# Patient Record
Sex: Male | Born: 1935 | Race: Black or African American | Hispanic: No | Marital: Married | Smoking: Former smoker
Health system: Southern US, Community
[De-identification: ages and names within clinical notes are randomized; demographics above are authoritative.]

## PROBLEM LIST (undated history)

## (undated) DIAGNOSIS — E119 Type 2 diabetes mellitus without complications: Secondary | ICD-10-CM

## (undated) DIAGNOSIS — H9190 Unspecified hearing loss, unspecified ear: Secondary | ICD-10-CM

## (undated) DIAGNOSIS — F0390 Unspecified dementia without behavioral disturbance: Secondary | ICD-10-CM

## (undated) DIAGNOSIS — I10 Essential (primary) hypertension: Secondary | ICD-10-CM

## (undated) DIAGNOSIS — K219 Gastro-esophageal reflux disease without esophagitis: Secondary | ICD-10-CM

## (undated) HISTORY — PX: EYE SURGERY: SHX253

---

## 2003-06-08 ENCOUNTER — Other Ambulatory Visit: Payer: Self-pay

## 2005-06-27 ENCOUNTER — Inpatient Hospital Stay: Payer: Self-pay | Admitting: Internal Medicine

## 2005-06-27 ENCOUNTER — Other Ambulatory Visit: Payer: Self-pay

## 2007-07-29 ENCOUNTER — Ambulatory Visit: Payer: Self-pay | Admitting: Family Medicine

## 2010-06-02 ENCOUNTER — Inpatient Hospital Stay: Payer: Self-pay | Admitting: Internal Medicine

## 2013-02-18 ENCOUNTER — Inpatient Hospital Stay: Payer: Self-pay | Admitting: Internal Medicine

## 2013-02-18 LAB — HEMOGLOBIN A1C: Hemoglobin A1C: 7.8 % — ABNORMAL HIGH (ref 4.2–6.3)

## 2013-02-18 LAB — LIPID PANEL
HDL Cholesterol: 77 mg/dL — ABNORMAL HIGH (ref 40–60)
Ldl Cholesterol, Calc: 89 mg/dL (ref 0–100)
Triglycerides: 66 mg/dL (ref 0–200)

## 2013-02-18 LAB — COMPREHENSIVE METABOLIC PANEL
Alkaline Phosphatase: 90 U/L (ref 50–136)
Anion Gap: 6 — ABNORMAL LOW (ref 7–16)
BUN: 15 mg/dL (ref 7–18)
Bilirubin,Total: 1 mg/dL (ref 0.2–1.0)
Calcium, Total: 9.5 mg/dL (ref 8.5–10.1)
Chloride: 103 mmol/L (ref 98–107)
EGFR (African American): >60
EGFR (Non-African Amer.): 52 — ABNORMAL LOW
Glucose: 257 mg/dL — ABNORMAL HIGH (ref 65–99)
Osmolality: 282 (ref 275–301)
Potassium: 3.6 mmol/L (ref 3.5–5.1)
SGOT(AST): 27 U/L (ref 15–37)
Sodium: 136 mmol/L (ref 136–145)
Total Protein: 7.4 g/dL (ref 6.4–8.2)

## 2013-02-18 LAB — APTT: Activated PTT: 34.3 secs (ref 23.6–35.9)

## 2013-02-18 LAB — CBC WITH DIFFERENTIAL/PLATELET
Basophil %: 0.7 %
Eosinophil %: 2.4 %
HGB: 12.7 g/dL — ABNORMAL LOW (ref 13.0–18.0)
Lymphocyte #: 1 10*3/uL (ref 1.0–3.6)
Lymphocyte %: 25.3 %
MCHC: 33.8 g/dL (ref 32.0–36.0)
Monocyte #: 0.4 x10 3/mm (ref 0.2–1.0)
Neutrophil #: 2.3 10*3/uL (ref 1.4–6.5)
Neutrophil %: 62 %
RBC: 4.28 10*6/uL — ABNORMAL LOW (ref 4.40–5.90)
RDW: 13.5 % (ref 11.5–14.5)

## 2013-02-18 LAB — URINALYSIS, COMPLETE
Blood: NEGATIVE
Glucose,UR: >=500 mg/dL (ref 0–75)
Ketone: NEGATIVE
Leukocyte Esterase: NEGATIVE
Nitrite: NEGATIVE
RBC,UR: 1 /HPF (ref 0–5)

## 2013-02-18 LAB — PROTIME-INR: INR: 1.1

## 2013-02-19 LAB — CBC WITH DIFFERENTIAL/PLATELET
Basophil %: 0.9 %
HCT: 35.1 % — ABNORMAL LOW (ref 40.0–52.0)
HGB: 11.7 g/dL — ABNORMAL LOW (ref 13.0–18.0)
MCH: 29.5 pg (ref 26.0–34.0)
MCHC: 33.4 g/dL (ref 32.0–36.0)
MCV: 88 fL (ref 80–100)
Neutrophil #: 1.2 10*3/uL — ABNORMAL LOW (ref 1.4–6.5)
Neutrophil %: 37.9 %
Platelet: 293 10*3/uL (ref 150–440)
RBC: 3.99 10*6/uL — ABNORMAL LOW (ref 4.40–5.90)
RDW: 13.2 % (ref 11.5–14.5)
WBC: 3.1 10*3/uL — ABNORMAL LOW (ref 3.8–10.6)

## 2013-02-19 LAB — BASIC METABOLIC PANEL
Co2: 28 mmol/L (ref 21–32)
Creatinine: 1.07 mg/dL (ref 0.60–1.30)
EGFR (African American): >60
EGFR (Non-African Amer.): 60 — ABNORMAL LOW
Osmolality: 279 (ref 275–301)
Potassium: 3.6 mmol/L (ref 3.5–5.1)

## 2013-07-11 ENCOUNTER — Emergency Department: Payer: Self-pay | Admitting: Emergency Medicine

## 2013-07-11 LAB — CBC
HCT: 35.1 % — ABNORMAL LOW (ref 40.0–52.0)
HGB: 11.7 g/dL — AB (ref 13.0–18.0)
MCH: 30 pg (ref 26.0–34.0)
MCHC: 33.3 g/dL (ref 32.0–36.0)
MCV: 90 fL (ref 80–100)
PLATELETS: 321 10*3/uL (ref 150–440)
RBC: 3.89 10*6/uL — AB (ref 4.40–5.90)
RDW: 13 % (ref 11.5–14.5)
WBC: 3.7 10*3/uL — AB (ref 3.8–10.6)

## 2013-07-11 LAB — TROPONIN I

## 2013-07-11 LAB — COMPREHENSIVE METABOLIC PANEL
Albumin: 3.3 g/dL — ABNORMAL LOW (ref 3.4–5.0)
Alkaline Phosphatase: 74 U/L
Anion Gap: 6 — ABNORMAL LOW (ref 7–16)
BILIRUBIN TOTAL: 0.5 mg/dL (ref 0.2–1.0)
BUN: 15 mg/dL (ref 7–18)
CO2: 25 mmol/L (ref 21–32)
Calcium, Total: 8.7 mg/dL (ref 8.5–10.1)
Chloride: 104 mmol/L (ref 98–107)
Creatinine: 1.21 mg/dL (ref 0.60–1.30)
EGFR (African American): 59 — ABNORMAL LOW
GFR CALC NON AF AMER: 51 — AB
GLUCOSE: 163 mg/dL — AB (ref 65–99)
OSMOLALITY: 275 (ref 275–301)
Potassium: 3.8 mmol/L (ref 3.5–5.1)
SGOT(AST): 14 U/L — ABNORMAL LOW (ref 15–37)
SGPT (ALT): 16 U/L (ref 12–78)
Sodium: 135 mmol/L — ABNORMAL LOW (ref 136–145)
Total Protein: 6.9 g/dL (ref 6.4–8.2)

## 2013-07-11 LAB — CK TOTAL AND CKMB (NOT AT ARMC)
CK, TOTAL: 170 U/L
CK-MB: 2.2 ng/mL (ref 0.5–3.6)

## 2013-12-10 ENCOUNTER — Emergency Department: Payer: Self-pay | Admitting: Emergency Medicine

## 2013-12-10 LAB — CBC
HCT: 40.3 % (ref 40.0–52.0)
HGB: 12.9 g/dL — ABNORMAL LOW (ref 13.0–18.0)
MCH: 28.9 pg (ref 26.0–34.0)
MCHC: 31.9 g/dL — ABNORMAL LOW (ref 32.0–36.0)
MCV: 91 fL (ref 80–100)
PLATELETS: 378 10*3/uL (ref 150–440)
RBC: 4.45 10*6/uL (ref 4.40–5.90)
RDW: 13.4 % (ref 11.5–14.5)
WBC: 4.6 10*3/uL (ref 3.8–10.6)

## 2013-12-10 LAB — URINALYSIS, COMPLETE
BACTERIA: NONE SEEN
BILIRUBIN, UR: NEGATIVE
BLOOD: NEGATIVE
Glucose,UR: >=500 mg/dL (ref 0–75)
Ketone: NEGATIVE
Leukocyte Esterase: NEGATIVE
Nitrite: NEGATIVE
PROTEIN: NEGATIVE
Ph: 5 (ref 4.5–8.0)
RBC,UR: 1 /HPF (ref 0–5)
SPECIFIC GRAVITY: 1.018 (ref 1.003–1.030)
SQUAMOUS EPITHELIAL: NONE SEEN

## 2013-12-10 LAB — COMPREHENSIVE METABOLIC PANEL
ALBUMIN: 3.3 g/dL — AB (ref 3.4–5.0)
ALK PHOS: 79 U/L
Anion Gap: 9 (ref 7–16)
BUN: 15 mg/dL (ref 7–18)
Bilirubin,Total: 0.7 mg/dL (ref 0.2–1.0)
CALCIUM: 8.7 mg/dL (ref 8.5–10.1)
Chloride: 104 mmol/L (ref 98–107)
Co2: 26 mmol/L (ref 21–32)
Creatinine: 1.22 mg/dL (ref 0.60–1.30)
GFR CALC AF AMER: 58 — AB
GFR CALC NON AF AMER: 50 — AB
Glucose: 190 mg/dL — ABNORMAL HIGH (ref 65–99)
OSMOLALITY: 283 (ref 275–301)
POTASSIUM: 4.2 mmol/L (ref 3.5–5.1)
SGOT(AST): 24 U/L (ref 15–37)
SGPT (ALT): 20 U/L
Sodium: 139 mmol/L (ref 136–145)
TOTAL PROTEIN: 7.4 g/dL (ref 6.4–8.2)

## 2013-12-10 LAB — TROPONIN I

## 2013-12-10 LAB — PRO B NATRIURETIC PEPTIDE: B-Type Natriuretic Peptide: 43 pg/mL (ref 0–450)

## 2014-01-09 LAB — CBC WITH DIFFERENTIAL/PLATELET
Basophil #: 0 10*3/uL (ref 0.0–0.1)
Basophil %: 1.2 %
EOS ABS: 0.4 10*3/uL (ref 0.0–0.7)
EOS PCT: 8.9 %
HCT: 38.8 % — ABNORMAL LOW (ref 40.0–52.0)
HGB: 12.6 g/dL — ABNORMAL LOW (ref 13.0–18.0)
Lymphocyte #: 1.6 10*3/uL (ref 1.0–3.6)
Lymphocyte %: 38.6 %
MCH: 29.1 pg (ref 26.0–34.0)
MCHC: 32.4 g/dL (ref 32.0–36.0)
MCV: 90 fL (ref 80–100)
MONO ABS: 0.4 x10 3/mm (ref 0.2–1.0)
Monocyte %: 10.6 %
Neutrophil #: 1.7 10*3/uL (ref 1.4–6.5)
Neutrophil %: 40.7 %
PLATELETS: 414 10*3/uL (ref 150–440)
RBC: 4.33 10*6/uL — ABNORMAL LOW (ref 4.40–5.90)
RDW: 13.5 % (ref 11.5–14.5)
WBC: 4.2 10*3/uL (ref 3.8–10.6)

## 2014-01-09 LAB — URINALYSIS, COMPLETE
Bacteria: NONE SEEN
Bilirubin,UR: NEGATIVE
Blood: NEGATIVE
Glucose,UR: >=500 mg/dL (ref 0–75)
Ketone: NEGATIVE
Leukocyte Esterase: NEGATIVE
Nitrite: NEGATIVE
Ph: 5 (ref 4.5–8.0)
Protein: NEGATIVE
RBC, UR: NONE SEEN /HPF (ref 0–5)
SQUAMOUS EPITHELIAL: NONE SEEN
Specific Gravity: 1.002 (ref 1.003–1.030)
WBC UR: NONE SEEN /HPF (ref 0–5)

## 2014-01-09 LAB — BASIC METABOLIC PANEL
Anion Gap: 10 (ref 7–16)
BUN: 8 mg/dL (ref 7–18)
CALCIUM: 8.2 mg/dL — AB (ref 8.5–10.1)
CHLORIDE: 108 mmol/L — AB (ref 98–107)
CO2: 22 mmol/L (ref 21–32)
Creatinine: 1.16 mg/dL (ref 0.60–1.30)
EGFR (African American): >60
EGFR (Non-African Amer.): 54 — ABNORMAL LOW
GLUCOSE: 168 mg/dL — AB (ref 65–99)
OSMOLALITY: 282 (ref 275–301)
Potassium: 3.7 mmol/L (ref 3.5–5.1)
SODIUM: 140 mmol/L (ref 136–145)

## 2014-01-09 LAB — TROPONIN I: Troponin-I: <0.02 ng/mL

## 2014-01-10 ENCOUNTER — Observation Stay: Payer: Self-pay | Admitting: Internal Medicine

## 2014-01-10 LAB — HEPATIC FUNCTION PANEL A (ARMC)
ALK PHOS: 83 U/L
Albumin: 3.6 g/dL (ref 3.4–5.0)
BILIRUBIN TOTAL: 0.5 mg/dL (ref 0.2–1.0)
Bilirubin, Direct: 0.2 mg/dL (ref 0.00–0.20)
SGOT(AST): 20 U/L (ref 15–37)
SGPT (ALT): 24 U/L
Total Protein: 7.1 g/dL (ref 6.4–8.2)

## 2014-01-11 LAB — BASIC METABOLIC PANEL
ANION GAP: 3 — AB (ref 7–16)
BUN: 15 mg/dL (ref 7–18)
CHLORIDE: 109 mmol/L — AB (ref 98–107)
CREATININE: 1.16 mg/dL (ref 0.60–1.30)
Calcium, Total: 8.3 mg/dL — ABNORMAL LOW (ref 8.5–10.1)
Co2: 28 mmol/L (ref 21–32)
EGFR (African American): >60
GFR CALC NON AF AMER: 54 — AB
GLUCOSE: 86 mg/dL (ref 65–99)
Osmolality: 280 (ref 275–301)
Potassium: 3.9 mmol/L (ref 3.5–5.1)
SODIUM: 140 mmol/L (ref 136–145)

## 2014-01-11 LAB — MAGNESIUM: Magnesium: 1.4 mg/dL — ABNORMAL LOW

## 2014-08-21 NOTE — H&P (Signed)
PATIENT NAME:  Ruben Ruiz, Ruben Ruiz MR#:  [id number] DATE OF BIRTH:  [birth year]  DATE OF ADMISSION:  02/18/2013  ADMITTING PHYSICIAN:  Gladstone Lighter, MD  PRIMARY CARE PHYSICIAN: [name] "Baltazar Apo, MD  CHIEF COMPLAINT: Slurred speech.   HISTORY OF PRESENT ILLNESS: Mr. Tallman is a sweet 81 year old elderly African American male with past medical history significant for hypertension, peptic ulcer disease, diabetes, hyperlipidemia and prior history of TIAs with no residual neurological deficits, unsteady gait at baseline who is still very functional, drives and still works on his cars at home.  He was brought in by his wife secondary to slurred speech that she noticed since last evening. The patient also has history of mild dementia and does have periods of confusion at baseline, according to the wife. His wife states that she noticed that his speech was more slow and not clear since last night and advised him to call the doctor or come to the ER but he refused.  They waited until this morning. Since it did not get any better, she brought him to the hospital. The patient does have unsteady gait at baseline. His CT head does show he does have moderate cerebral and cerebellar atrophy. His wife also mentions that she gave him some water this morning and the patient had trouble swallowing the water and choked on it and since then she has not given him anything by mouth. The patient already takes aspirin at baseline per his prior TIAs.    PAST MEDICAL HISTORY: 1.  Peptic ulcer disease.  2.  History of prior TIAs with no residual neurological deficits.  3.  Unsteady gait.  4.  Mild dementia.  5.  Diabetes.  6.  Hypertension.  7.  Hyperlipidemia:   PAST SURGICAL HISTORY: Cataract surgery.   ALLERGIES: No known drug allergies.   CURRENT HOME MEDICATIONS:  1.  Actos 30 mg p.o. daily.  2.  Aspirin 325 mg p.o. daily.  3.  Glipizide XL 10 mg p.o. daily.  4.  Losartan 100 mg p.o. daily.  5.  Metformin  1000 mg p.o. daily.  6.  Toprol 50 mg p.o. daily.  7.  Omeprazole 40 mg p.o. daily.  8.  Zocor 20 mg p.o. daily.   SOCIAL HISTORY: Lives at home with his wife, who is about 78 years younger to him.  Denies any smoking, alcohol or drug use. He has been working on cars and still actually has a garage and works on his cars.   FAMILY HISTORY: History of strokes in the family, especially his sister, who had multiple strokes.   REVIEW OF SYSTEMS: CONSTITUTIONAL: No fever, fatigue or weakness.  EYES: Positive for blurry vision and a history of cataracts. Uses reading glasses but no double vision, inflammation or glaucoma.  ENT: Mild hearing loss and uses hearing aids, especially in his left ear. Otherwise no tinnitus, ear pain, epistaxis or discharge. Dysphagia noted today per wife.  RESPIRATORY: No cough, wheeze, hemoptysis or COPD. CARDIOVASCULAR: No chest pain, orthopnea, edema, arrhythmia, palpitations or syncope.  GASTROINTESTINAL: No nausea, vomiting, diarrhea, abdominal pain, hematemesis or melena.  GENITOURINARY: No dysuria, hematuria, renal calculus, frequency or incontinence.  ENDOCRINE: No polyuria, nocturia, thyroid problems, heat or cold intolerance.  HEMATOLOGY: No anemia, easy bruising or bleeding.  SKIN: No acne, rash or lesions.  MUSCULOSKELETAL: No neck, back, shoulder pain, arthritis or gout.  NEUROLOGICAL: Prior history of CVA and TIAs and ataxia. No seizures, no numbness or tingling. Positive for slurred speech.  PSYCHOLOGICAL: No anxiety,  insomnia, depression.   PHYSICAL EXAMINATION: VITAL SIGNS: Temperature 97.9 degrees Fahrenheit, pulse 72, respirations 18, blood pressure 152/77, pulse ox 98% on room air.  GENERAL: A well-built, ill-nourished, elderly male lying in bed, not in any acute distress.  HEENT: Normocephalic, atraumatic. Pupils are equal, round, reacting to light; postsurgical, pupils. Extraocular movements are intact, anicteric sclerae. No conjunctival pallor.  Nasopharynx no nasal lesions or discharge. Ears with no discharge or external lesions. Oropharynx clear without erythema, mass or exudates.  NECK: Supple. No thyromegaly, JVD or carotid bruits. No lymphadenopathy. Full range of motion without pain is present.  LUNGS: Clear to auscultation bilaterally. No wheeze or crackles. No use of accessory muscles for breathing.  CARDIOVASCULAR: S1 and S2, regular rate and rhythm. Faint heart sounds. Pulses are equal at carotids. No rubs or gallops.  ABDOMEN: Soft, nontender, nondistended. No hepatosplenomegaly. Normal bowel sounds.  EXTREMITIES: No pedal edema. No clubbing or cyanosis; 1+ dorsalis pedis pulses palpable bilaterally.  SKIN: No acne, rash or lesions.  LYMPHATICS: No cervical or inguinal lymphadenopathy.  NEUROLOGICAL:  Cranial nerves II through XII remain intact except for his speech is slightly  slurred, facial symmetry is maintained. Motor exam is 5/5 all 4 extremities. Sensation is intact and his deep tendon reflexes are 1+ symmetric bilaterally.  PSYCHOLOGICAL: The patient is awake, alert, oriented x 3.   LABORATORY AND RADIOLOGICAL DATA:  1.  Urinalysis negative for any infection.  2.  WBC 3.8, hemoglobin 12.7, hematocrit 37.6, platelet count 287.  3.  Sodium 136, potassium 3.6, chloride 103, bicarb 27, BUN 15, creatinine 1.2, glucose 257 and calcium of 9.5.  4.  ALT 20, AST 27, alk phos 90, total bilirubin 1.0 and albumin of 3.6.  5.  INR 1.1, PTT 34.3.  6.  CT of the head showing moderate stable diffuse cerebral and cerebellar atrophy and bifrontal regions are most significantly involved.  New hypodensity in the basal ganglia on the left which is sequela of prior ischemic infarct which is new since his previous study. No evidence of acute or hemorrhagic ischemic changes. No evidence of acute skull fracture.  7.  EKG showing normal sinus rhythm, heart rate of 74. No acute ST-T wave abnormalities.   ASSESSMENT AND PLAN: A 81 year old male  with past medical history significant for cerebrovascular accident, transient ischemic attack with no residual neurological deficits, unsteady gait at baseline, hypertension, diabetes who is active at baseline, brought by wife secondary to slurred speech since last evening.  1.  Possible transient ischemic attack with slurred speech which is improving. No other cranial nerve deficits noted. Also noted some dysphagia so will admit the patient, start neuro checks. Will get swallow and speech evals, monitoring on telemetry. Will get an MRI of the brain, carotid ultrasound and echo Doppler. Check lipid profile and HbA1c. Continue his aspirin and statin for now until the MRI shows any acute changes. Will also get physical therapy consult for his unsteady gait, see if he might benefit from home health.  2.  Hypertension. Continue home medication. The patient is on losartan and Toprol.  3.  Diabetes mellitus. Check HbA1c, continue sliding scale insulin and his Actos and glipizide and metformin for now.  4.  Hyperlipidemia. The patient is on Zocor which we will continue. Check a lipid profile.  5.  Gastrointestinal and deep vein thrombosis prophylaxis. The patient is on omeprazole and subcu heparin.  6.  CODE STATUS: Full code.   TIME SPENT ON ADMISSION: 50 minutes.   ____________________________ Hart Rochester  Tressia Miners, MD rk:cs D: 02/18/2013 15:00:00 ET T: 02/18/2013 15:24:30 ET JOB#: [id number]  cc: Gladstone Lighter, MD, <Dictator> [name] "Sallie" Posey Pronto, MD Gladstone Lighter MD ELECTRONICALLY SIGNED 02/19/2013 14:20

## 2014-08-21 NOTE — Discharge Summary (Signed)
PATIENT NAME:  Ruben Ruiz, Ruben Ruiz DATE OF BIRTH:  October 01, 1917  DATE OF ADMISSION:  02/18/2013 DATE OF DISCHARGE:  02/20/2013  PRESENTING COMPLAINT: Slurred speech.   DISCHARGE DIAGNOSES:  1. Acute left basal ganglia infarct/cerebrovascular accident.  2. Hypertension.  3. Type 2 diabetes.   CONDITION ON DISCHARGE: Fair.   CODE STATUS: FULL CODE.   DISCHARGE MEDICATIONS:  1. Omeprazole 40 mg p.o. daily.  2. Metformin 1000 mg p.o. daily.  3. Metoprolol 50 mg extended release p.o. daily.  4. Losartan 100 mg daily.  5. Zocor 20 mg 1 tablet at bedtime.  6. Glipizide XL 10 mg extended release p.o. daily.  7. Actos 30 mg p.o. daily.  8. Aspirin.  9. Aggrenox 1 tablet b.i.d.   DIET: Mechanical soft, nectar thick liquids.   FOLLOWUP: With Dr. Maryruth HancockSallie Alfa Leibensperger in 1 to 2 weeks.   LABORATORIES AND DIAGNOSTIC STUDIES: Echo Doppler showed EF of 60% to 65%, normal global left ventricular systolic function. H and H is 11.7 and 35.1. White count is 3.1. Glucose is 167. BUN is 10. Creatinine is 1.07. Sodium is 138. Potassium is 3.6. Magnesium is 1.3, repleted.   Carotid Doppler: No hemodynamically significant carotid stenosis.   MRI of the brain showed acute ischemic change involving the mid body of the corpus callosum on the left extending into the left basal ganglia.   UA negative for UTI. Hemoglobin A1c is 7.8.   Cholesterol is 179. Triglycerides 66. HDL is 77. LDL is 89.   BRIEF SUMMARY OF HOSPITAL COURSE: Ruben Ruiz is a pleasant 81 year old African American gentleman with history of hypertension, type 2 diabetes who is very active on a routine basis. Comes to the Emergency Room after he was found to have slurred speech noticed by his wife. He was admitted with:  1. Acute CVA: Speech improved.  No deficits were noted. MRI brain was positive for acute left basal ganglia infarct. Carotid Doppler was negative for stenosis, and echo Doppler has normal EF of 60% with normal left  ventricular systolic function. The patient was started on Aggrenox, and statins were continued. No neuro deficits were noted. The patient did not have any PT needs at home per evaluation.  2. Hypertension: Home dose of losartan and Toprol were continued.  3. Type 2 diabetes: The patient is on Actos, glipizide and metformin.  4. Hyperlipidemia: Continued statins.  5. GERD: On omeprazole.   Hospital stay otherwise remained stable.   The patient remained a FULL CODE.     ____________________________ Wylie HailSona A. Allena KatzPatel, MD sap:gb D: 02/20/2013 15:49:05 ET T: 02/20/2013 20:11:09 ET JOB#: [id number]  cc: [name] A. Allena KatzPatel, MD, <Dictator> [name], MD [name] MD ELECTRONICALLY SIGNED 02/23/2013 10:56

## 2014-08-22 NOTE — H&P (Signed)
PATIENT NAME:  Ruben Ruiz, Ruben Ruiz MR#:  [id number] DATE OF BIRTH:  02-28-1918  DATE OF ADMISSION:  01/10/2014  REFERRING PHYSICIAN: Eartha Inchory R. York CeriseForbach, MD  PRIMARY CARE PHYSICIAN: [name] "Maryruth HancockSallie" Patel, MD  CHIEF COMPLAINT: "Something in my head of loose".   HISTORY OF PRESENT ILLNESS: The patient is a 81 year old African American gentleman with past medical of TIA as well as CVA without residual neurological deficit, hypertension, hyperlipidemia, type 2 diabetes non insulin requiring and uncomplicated, presenting with dizziness. Essentially, he describes a 1 month's duration of dizziness, described mainly as room spinning as well as "something in my head is loose". He was [facility] at the onset of symptoms, and he was discharged with Antivert and had vast improvement of his symptoms. However, he ran out of medications and did not return for the last few days. He denies any further symptomatology. Once again, he describes the events as a room spinning sensation, particularly whenever he is ambulating or sitting upright. He has no symptoms whenever he is lying flat. He notices no change when turning his head from the left or right. No change in hearing. No associated vision changes.   REVIEW OF SYSTEMS:  CONSTITUTIONAL: Denies fever, fatigue, weakness.  EYES: Denies blurry vision, double vision, or eye pain.  EARS, NOSE, THROAT: Denies any tinnitus, ear pain, hearing loss.  RESPIRATORY: Denies cough or shortness of breath.  CARDIOVASCULAR: Denies chest pain, palpitations, edema.  GASTROINTESTINAL: Denies nausea or vomiting, diarrhea or abdominal pain.  GENITOURINARY: Denies dysuria or hematuria.  ENDOCRINE: Denies nocturia or thyroid problems.  HEMATOLOGIC AND LYMPHATIC: Denies easy bruising or bleeding.  SKIN: Denies rash or lesions.  MUSCULOSKELETAL: Denies pain in neck, back, shoulder, knees, hips or arthritic symptoms.  NEUROLOGIC: Denies any paralysis or  paresthesias.  PSYCHIATRIC: Denies anxiety or depressive symptoms.  Otherwise, full review of systems was performed and is negative.   PAST MEDICAL HISTORY: History of CVA as well as TIA without residual neurological deficits, peptic ulcer disease, hypertension, hyperlipidemia, type 2 diabetes non insulin requiring and uncomplicated.   SOCIAL HISTORY: No alcohol, tobacco, or drug usage.   FAMILY HISTORY: Positive for CVA in multiple family members.   ALLERGIES: No known drug allergies.   HOME MEDICATIONS: Aspirin 325 mg p.o. daily, losartan 100 mg p.o. daily, glipizide 10 mg p.o. daily, metformin 1000 mg p.o. daily, pravastatin 20 mg p.o. daily, Prilosec 20 mg 2 tablets p.o. daily.   PHYSICAL EXAMINATION:  VITAL SIGNS: Temperature 98.4, heart rate 79, respirations 14, blood pressure 195/ 91, saturating 98% on room air. Weight 76.2 kg. BMI 24.8.  GENERAL: Well-nourished, well-developed, African American gentleman, currently in no acute distress.  HEAD: Normocephalic, atraumatic.  EYES: Pupils are equal, round, and reactive to light. Extraocular muscles are intact. No scleral icterus.  MOUTH: Dry mucosal membranes. Dentition poor. No abscess noted.  EARS, NOSE, AND THROAT: Clear without exudates. No external lesions.  NECK: Supple. No thyromegaly. No nodules. No JVD.  PULMONARY: Clear to auscultation bilaterally without wheezes, rales or rhonchi. No use of accessory muscles. Good respiratory effort. Chest not tender to palpation.  CARDIOVASCULAR: S1, S2. Regular rate and rhythm. No murmurs, rubs, or gallops. No edema. Pedal pulses 2+ bilaterally.  GASTROINTESTINAL: Soft, nontender, nondistended. No masses. Positive bowel sounds. No hepatosplenomegaly.  MUSCULOSKELETAL: No swelling, clubbing, or edema. Range of motion is full in all extremities.  NEUROLOGIC: Cranial nerves II through XII intact. No gross focal neurologic deficits. Sensation intact. Reflexes intact. He has no nystagmus  present.  No vertigo elicited during the Dix-Hallpike maneuver.  SKIN: No ulceration, lesions, rash, cyanosis. Skin warm and dry. Turgor intact.  PSYCHIATRIC: Mood and affect are within normal limits. The patient is alert and oriented x 3. Insight and judgment intact.   LABORATORY DATA: CT head performed and reveals no acute intracranial process. Remainder of Laboratory Data: Sodium 140, potassium 3.7, chloride 108, bicarbonate 22, BUN 8, creatinine 1.16 glucose 168. LFTs within normal limits. WBC 4.2, hemoglobin 12.6, platelets 414,000. Urinalysis negative for evidence of infection.   ASSESSMENT AND PLAN: The patient is a 81 year old African American gentleman with a history of transient ischemic attack as well as cerebrovascular accident, presenting with dizziness.  1.  Vertigo/dizziness. Continue p.r.n. Antivert as well as physical therapy evaluation. Reading through prior hospitalizations, it seems that this is a chronic and recurrent problem for the last few years. Regardless, he is unsafe to ambulate at this time.  2.  Type 2 diabetes, non insulin requiring and uncomplicated. Hold p.o. agents. Add insulin sliding scale and q. 6 hour Accu-Cheks.  3.  Hypertension. Restart home medications, as well as the addition of p.r.n. hydralazine for a systolic blood pressure greater than 180 and diastolic greater than 100.  4.  Deep venous thrombosis prophylaxis with heparin subcutaneous.   CODE STATUS: The patient is a Full Code.   TIME SPENT: 45 minutes.    ____________________________ Cletis Athens. Hower, MD dkh:MT D: 01/10/2014 02:43:04 ET T: 01/10/2014 05:26:49 ET JOB#: [id number]  cc: Cletis Athens. Hower, MD, <Dictator> DAVID Synetta Shadow MD ELECTRONICALLY SIGNED 01/10/2014 20:36

## 2014-08-22 NOTE — Discharge Summary (Signed)
PATIENT NAME:  Ruben Ruiz, Harman MR#:  [id number] DATE OF BIRTH:  12-14-1917  DATE OF ADMISSION:  01/10/2014 DATE OF DISCHARGE:  01/11/2014  ADMITTING PHYSICIAN: Angelica Ranavid Hower, M.D.   DISCHARGING PHYSICIAN: Enid Baasadhika Keelan Tripodi, M.D.   PRIMARY CARE PHYSICIAN: Maryruth HancockSallie Patel, M.D.   CONSULTATIONS IN THE HOSPITAL: None.   DISCHARGE DIAGNOSES:  1. Vertigo.  2. History of cerebrovascular accident with no residual neurological deficits.  3. Hypertension.  4. Diabetes mellitus.   DISCHARGE HOME MEDICATIONS:  1. Metformin 1000 mg p.o. daily.  2. Losartan 100 mg p.o. daily.  3. Omeprazole 20 mg 2 capsules daily.  4. Pravastatin 20 mg p.o. in the evening.  5. Aspirin 325 mg p.o. daily.  6. Meclizine 25 mg p.o. 3 times a day.   DISCHARGE DIET: Low-sodium diet.   DISCHARGE ACTIVITY: As tolerated.   FOLLOWUP INSTRUCTIONS:  1. PCP follow-up in 1 to 2 weeks.  2. Home health physical therapy.   LABORATORIES AND IMAGING STUDIES PRIOR TO DISCHARGE: Sodium 140, potassium 3.9, chloride 109, bicarbonate 28, BUN 15, creatinine 1.1, glucose 86 and calcium of 8.3, magnesium 1.4.   CT of the head without contrast on admission showing no acute intracranial pathology. Moderate cortical volume loss and scattered small vessel ischemic changes noted, and small chronic infarct in left corona radiata and basal ganglion noted.   WBC 4.2, hemoglobin 12.6, hematocrit 38.2, platelet count 414,000. Troponins negative.   Urinalysis negative for any infection.   ALT 24, AST 20, alkaline phosphatase 83, total bilirubin 0.5, albumin of 3.6.   BRIEF HOSPITAL COURSE: Mr. Ruben Ruiz is a 81 year old, elderly African American male, with a past medical history significant for hypertension, diabetes, history of stroke with no residual neurological deficits, who is very functional at baseline, who also has known history of chronic dizziness likely secondary to inner ear problems, and was on meclizine with improvement in the past,  comes to the hospital as he ran out of his meclizine and has been extremely dizzy, even with minimal movement.   1. 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 He has been on meclizine with improvement; however, ran out of medicine, who was admitted under observation. Orthostatic vitals were checked with no significant drop in his blood pressure or increase in his heart rate. He was restarted back on meclizine, worked with physical therapy. He had significant improvement in his dizziness with the meclizine and was addressed to be discharged with home health physical therapy recommendations.  2. Hypertension. His losartan is being continued. 3. Diabetes mellitus. He is on metformin, which will be continued. However, we are holding his glipizide since his sugars were in the normal range. He was addressed to follow up with PCP whether he will need the glipizide or not. 4. History of CVA, on aspirin and statin. No residual neurological deficits.   CODE STATUS: Full Code.   TIME SPENT ON DISCHARGE: 40 minutes.   ____________________________ Enid Baasadhika Ryonna Cimini, MD rk:JT D: 01/11/2014 10:53:01 ET T: 01/11/2014 11:31:00 ET JOB#: [id number]  cc: Enid Baasadhika Kaulder Zahner, MD, <Dictator> [name], MD Enid BaasADHIKA Sander Speckman MD ELECTRONICALLY SIGNED 01/29/2014 9:53

## 2015-06-18 ENCOUNTER — Observation Stay
Admit: 2015-06-18 | Discharge: 2015-06-18 | Disposition: A | Discharge status: Home / Self Care | Payer: Medicare Other | Attending: Internal Medicine | Admitting: Internal Medicine

## 2015-06-18 ENCOUNTER — Observation Stay
Admission: EM | Admit: 2015-06-18 | Discharge: 2015-06-20 | Disposition: A | Discharge status: Home / Self Care | Payer: Medicare Other | Attending: Internal Medicine | Admitting: Internal Medicine

## 2015-06-18 ENCOUNTER — Encounter: Payer: Self-pay | Admitting: Emergency Medicine

## 2015-06-18 DIAGNOSIS — Z8673 Personal history of transient ischemic attack (TIA), and cerebral infarction without residual deficits: Secondary | ICD-10-CM | POA: Diagnosis not present

## 2015-06-18 DIAGNOSIS — R55 Syncope and collapse: Principal | ICD-10-CM | POA: Diagnosis present

## 2015-06-18 DIAGNOSIS — D649 Anemia, unspecified: Secondary | ICD-10-CM | POA: Diagnosis not present

## 2015-06-18 DIAGNOSIS — I739 Peripheral vascular disease, unspecified: Secondary | ICD-10-CM | POA: Diagnosis not present

## 2015-06-18 DIAGNOSIS — E119 Type 2 diabetes mellitus without complications: Secondary | ICD-10-CM | POA: Insufficient documentation

## 2015-06-18 DIAGNOSIS — F0390 Unspecified dementia without behavioral disturbance: Secondary | ICD-10-CM | POA: Diagnosis not present

## 2015-06-18 DIAGNOSIS — K219 Gastro-esophageal reflux disease without esophagitis: Secondary | ICD-10-CM | POA: Diagnosis not present

## 2015-06-18 DIAGNOSIS — Z7982 Long term (current) use of aspirin: Secondary | ICD-10-CM | POA: Diagnosis not present

## 2015-06-18 DIAGNOSIS — Z79899 Other long term (current) drug therapy: Secondary | ICD-10-CM | POA: Diagnosis not present

## 2015-06-18 DIAGNOSIS — I6523 Occlusion and stenosis of bilateral carotid arteries: Secondary | ICD-10-CM | POA: Diagnosis not present

## 2015-06-18 DIAGNOSIS — I160 Hypertensive urgency: Secondary | ICD-10-CM

## 2015-06-18 DIAGNOSIS — Z87891 Personal history of nicotine dependence: Secondary | ICD-10-CM | POA: Diagnosis not present

## 2015-06-18 DIAGNOSIS — Z7984 Long term (current) use of oral hypoglycemic drugs: Secondary | ICD-10-CM | POA: Diagnosis not present

## 2015-06-18 DIAGNOSIS — E11649 Type 2 diabetes mellitus with hypoglycemia without coma: Secondary | ICD-10-CM | POA: Diagnosis not present

## 2015-06-18 DIAGNOSIS — H579 Unspecified disorder of eye and adnexa: Secondary | ICD-10-CM

## 2015-06-18 DIAGNOSIS — W19XXXA Unspecified fall, initial encounter: Secondary | ICD-10-CM

## 2015-06-18 HISTORY — DX: Essential (primary) hypertension: I10

## 2015-06-18 HISTORY — DX: Type 2 diabetes mellitus without complications: E11.9

## 2015-06-18 HISTORY — DX: Unspecified dementia, unspecified severity, without behavioral disturbance, psychotic disturbance, mood disturbance, and anxiety: F03.90

## 2015-06-18 HISTORY — DX: Gastro-esophageal reflux disease without esophagitis: K21.9

## 2015-06-18 LAB — BASIC METABOLIC PANEL
Anion gap: 8 (ref 5–15)
BUN: 14 mg/dL (ref 6–20)
CHLORIDE: 101 mmol/L (ref 101–111)
CO2: 28 mmol/L (ref 22–32)
CREATININE: 1.11 mg/dL (ref 0.61–1.24)
Calcium: 9.4 mg/dL (ref 8.9–10.3)
GFR, EST NON AFRICAN AMERICAN: 54 mL/min — AB (ref >60)
Glucose, Bld: 228 mg/dL — ABNORMAL HIGH (ref 65–99)
Potassium: 3.9 mmol/L (ref 3.5–5.1)
SODIUM: 137 mmol/L (ref 135–145)

## 2015-06-18 LAB — URINALYSIS COMPLETE WITH MICROSCOPIC (ARMC ONLY)
BILIRUBIN URINE: NEGATIVE
Bacteria, UA: NONE SEEN
Leukocytes, UA: NEGATIVE
NITRITE: NEGATIVE
PH: 5 (ref 5.0–8.0)
Protein, ur: NEGATIVE mg/dL
Specific Gravity, Urine: 1.021 (ref 1.005–1.030)
Squamous Epithelial / LPF: NONE SEEN

## 2015-06-18 LAB — TSH: TSH: 3.707 u[IU]/mL (ref 0.350–4.500)

## 2015-06-18 LAB — GLUCOSE, CAPILLARY
GLUCOSE-CAPILLARY: 177 mg/dL — AB (ref 65–99)
GLUCOSE-CAPILLARY: 154 mg/dL — AB (ref 65–99)
Glucose-Capillary: 124 mg/dL — ABNORMAL HIGH (ref 65–99)

## 2015-06-18 LAB — TROPONIN I: Troponin I: <0.03 ng/mL (ref <0.031)

## 2015-06-18 LAB — CBC
HCT: 42.9 % (ref 40.0–52.0)
Hemoglobin: 13.9 g/dL (ref 13.0–18.0)
MCH: 28.5 pg (ref 26.0–34.0)
MCHC: 32.4 g/dL (ref 32.0–36.0)
MCV: 87.9 fL (ref 80.0–100.0)
PLATELETS: 520 10*3/uL — AB (ref 150–440)
RBC: 4.88 MIL/uL (ref 4.40–5.90)
RDW: 13.5 % (ref 11.5–14.5)
WBC: 4.4 10*3/uL (ref 3.8–10.6)

## 2015-06-18 LAB — HEMOGLOBIN A1C: Hgb A1c MFr Bld: 5.1 % (ref 4.0–6.0)

## 2015-06-18 MED ORDER — HYDRALAZINE HCL 20 MG/ML IJ SOLN
10.0000 mg | Freq: Four times a day (QID) | INTRAMUSCULAR | Status: DC | PRN
  Administered 2015-06-18: 10 mg via INTRAVENOUS
  Filled 2015-06-18: qty 1

## 2015-06-18 MED ORDER — LOSARTAN POTASSIUM 50 MG PO TABS
100.0000 mg | ORAL_TABLET | Freq: Every day | ORAL | Status: DC
  Administered 2015-06-18 – 2015-06-19 (×2): 100 mg via ORAL
  Filled 2015-06-18 (×2): qty 2

## 2015-06-18 MED ORDER — ONDANSETRON HCL 4 MG PO TABS
4.0000 mg | ORAL_TABLET | Freq: Four times a day (QID) | ORAL | Status: DC | PRN
Start: 2015-06-18 — End: 2015-06-20

## 2015-06-18 MED ORDER — METFORMIN HCL 500 MG PO TABS
1000.0000 mg | ORAL_TABLET | Freq: Every day | ORAL | Status: DC
  Administered 2015-06-20: 1000 mg via ORAL
  Filled 2015-06-18: qty 2

## 2015-06-18 MED ORDER — INSULIN ASPART 100 UNIT/ML ~~LOC~~ SOLN: 0.0000 [IU] | Freq: Every day | SUBCUTANEOUS | Status: DC

## 2015-06-18 MED ORDER — ENOXAPARIN SODIUM 40 MG/0.4ML ~~LOC~~ SOLN
40.0000 mg | SUBCUTANEOUS | Status: DC
  Administered 2015-06-18 – 2015-06-19 (×2): 40 mg via SUBCUTANEOUS
  Filled 2015-06-18 (×2): qty 0.4

## 2015-06-18 MED ORDER — PRAVASTATIN SODIUM 20 MG PO TABS
20.0000 mg | ORAL_TABLET | Freq: Every day | ORAL | Status: DC
  Administered 2015-06-18 – 2015-06-19 (×2): 20 mg via ORAL
  Filled 2015-06-18 (×2): qty 1

## 2015-06-18 MED ORDER — ONDANSETRON HCL 4 MG/2ML IJ SOLN: 4.0000 mg | Freq: Four times a day (QID) | INTRAMUSCULAR | Status: DC | PRN

## 2015-06-18 MED ORDER — GLIPIZIDE ER 10 MG PO TB24
10.0000 mg | ORAL_TABLET | Freq: Every day | ORAL | Status: DC
  Administered 2015-06-20: 10 mg via ORAL
  Filled 2015-06-18: qty 1

## 2015-06-18 MED ORDER — METOPROLOL SUCCINATE ER 50 MG PO TB24
50.0000 mg | ORAL_TABLET | Freq: Every day | ORAL | Status: DC
  Administered 2015-06-18 – 2015-06-19 (×2): 50 mg via ORAL
  Filled 2015-06-18 (×2): qty 1

## 2015-06-18 MED ORDER — ASPIRIN EC 81 MG PO TBEC
81.0000 mg | DELAYED_RELEASE_TABLET | Freq: Every day | ORAL | Status: DC
  Administered 2015-06-18 – 2015-06-20 (×3): 81 mg via ORAL
  Filled 2015-06-18 (×3): qty 1

## 2015-06-18 MED ORDER — SODIUM CHLORIDE 0.9% FLUSH
3.0000 mL | Freq: Two times a day (BID) | INTRAVENOUS | Status: DC
  Administered 2015-06-18 – 2015-06-20 (×4): 3 mL via INTRAVENOUS

## 2015-06-18 MED ORDER — INSULIN ASPART 100 UNIT/ML ~~LOC~~ SOLN
0.0000 [IU] | Freq: Three times a day (TID) | SUBCUTANEOUS | Status: DC
  Administered 2015-06-20: 2 [IU] via SUBCUTANEOUS
  Filled 2015-06-18: qty 2

## 2015-06-18 MED ORDER — ACETAMINOPHEN 650 MG RE SUPP: 650.0000 mg | Freq: Four times a day (QID) | RECTAL | Status: DC | PRN

## 2015-06-18 MED ORDER — PANTOPRAZOLE SODIUM 40 MG PO TBEC
40.0000 mg | DELAYED_RELEASE_TABLET | Freq: Every day | ORAL | Status: DC
  Administered 2015-06-19 – 2015-06-20 (×2): 40 mg via ORAL
  Filled 2015-06-18 (×2): qty 1

## 2015-06-18 MED ORDER — ACETAMINOPHEN 325 MG PO TABS: 650.0000 mg | ORAL_TABLET | Freq: Four times a day (QID) | ORAL | Status: DC | PRN

## 2015-06-18 NOTE — ED Notes (Signed)
States he has found himself on the floor ..not knowing what happened.Marland Kitchen He thinks it is his medication. This has been going on for about 1 month

## 2015-06-18 NOTE — ED Notes (Signed)
Attempted to call report, nurse will call back.

## 2015-06-18 NOTE — Progress Notes (Addendum)
Ruben Ruiz is a 82 y.o. male patient admitted from ED awake, alert - oriented  X 4 - no acute distress noted.  VSS - Blood pressure 169/81, pulse 75, temperature 98.2 F (36.8 C), temperature source Oral, resp. rate 16, height  (1.753 m), weight 68.04 kg (150 lb), SpO2 100 %.    IV in place, occlusive dsg intact without redness.  Orientation to room, and floor completed with information packet given to patient/family. Admission INP armband ID verified with patient/family, and in place.   SR up x 2, fall assessment complete, with patient and family able to verbalize understanding of risk associated with falls, and verbalized understanding to call nsg before up out of bed.  Call light within reach, patient able to voice, and demonstrate understanding.  Skin, clean-dry- intact without evidence of bruising, or skin tears.   No evidence of skin break down noted on exam. Skin assessed with Erline Hau. Tele box verified with Kemper Durie, RN and Sisters, NT.     Will cont to eval and treat per MD orders.  Rudean Haskell, RN 06/18/2015 6:53 PM

## 2015-06-18 NOTE — ED Provider Notes (Addendum)
[facility] Emergency Department Provider Note     Time seen: ----------------------------------------- 2:46 PM on 06/18/2015 -----------------------------------------    I have reviewed the triage vital signs and the nursing notes.   HISTORY  Chief Complaint Near Syncope    HPI Joven Mom is a 82 y.o. male who presents to the ER after a suspected syncopal event. Family states they found him on the floor not knowing what happened. Patient thinks his medications may be doing this will be have not changed recently. He's had several episodes over the last month, denies any recent illness, denies any pain or recent sickness. He does have a history of dementia.   Past Medical History  Diagnosis Date  . Dementia   . Hypertension   . Diabetes mellitus without complication (HCC)   . GERD (gastroesophageal reflux disease)     There are no active problems to display for this patient.   History reviewed. No pertinent past surgical history.  Allergies Review of patient's allergies indicates no known allergies.  Social History Social History  Substance Use Topics  . Smoking status: Former Games developer  . Smokeless tobacco: None  . Alcohol Use: No    Review of Systems Constitutional: Negative for fever. Eyes: Negative for visual changes. ENT: Negative for sore throat. Cardiovascular: Negative for chest pain. Respiratory: Negative for shortness of breath. Gastrointestinal: Negative for abdominal pain, vomiting and diarrhea. Genitourinary: Negative for dysuria. Musculoskeletal: Negative for back pain. Skin: Negative for rash. Neurological: Negative for headaches, focal weakness or numbness.  10-point ROS otherwise negative.  ____________________________________________   PHYSICAL EXAM:  VITAL SIGNS: ED Triage Vitals  Enc Vitals Group     BP 06/18/15 1248 154/110 mmHg     Pulse Rate 06/18/15 1248 71     Resp 06/18/15 1248 18     Temp  06/18/15 1248 97.8 F (36.6 C)     Temp Source 06/18/15 1248 Oral     SpO2 06/18/15 1248 98 %     Weight 06/18/15 1248 150 lb (68.04 kg)     Height 06/18/15 1248  (1.753 m)     Head Cir --      Peak Flow --      Pain Score --      Pain Loc --      Pain Edu? --      Excl. in GC? --     Constitutional: Alert and oriented. Well appearing and in no distress. Eyes: Conjunctivae are normal. Normal extraocular movements. ENT   Head: Normocephalic and atraumatic.   Nose: No congestion/rhinnorhea.   Mouth/Throat: Mucous membranes are moist.   Neck: No stridor. Cardiovascular: Normal rate, regular rhythm. Normal and symmetric distal pulses are present in all extremities. No murmurs, rubs, or gallops. Respiratory: Normal respiratory effort without tachypnea nor retractions. Breath sounds are clear and equal bilaterally. No wheezes/rales/rhonchi. Gastrointestinal: Soft and nontender. No distention. No abdominal bruits.  Musculoskeletal: Nontender with normal range of motion in all extremities. No joint effusions.  No lower extremity tenderness nor edema. Neurologic:  Normal speech and language. No gross focal neurologic deficits are appreciated. Speech is normal. No gait instability. Skin:  Skin is warm, dry and intact. No rash noted. Psychiatric: Mood and affect are normal.  ____________________________________________  EKG: Interpreted by me. Normal sinus rhythm with occasional PVCs, rate is 66 bpm, normal PR interval, normal QRS, normal QT interval. Normal axis. Nonspecific ST and T-wave changes.  ____________________________________________  ED COURSE:  Pertinent labs & imaging results  that were available during my care of the patient were reviewed by me and considered in my medical decision making (see chart for details). Patient looks remarkably good for his stated age. We'll check basic labs and reevaluate. ____________________________________________    LABS  (pertinent positives/negatives)  Labs Reviewed  BASIC METABOLIC PANEL - Abnormal; Notable for the following:    Glucose, Bld 228 (*)    GFR calc non Af Amer 54 (*)    All other components within normal limits  CBC - Abnormal; Notable for the following:    Platelets 520 (*)    All other components within normal limits  GLUCOSE, CAPILLARY - Abnormal; Notable for the following:    Glucose-Capillary 177 (*)    All other components within normal limits  CBG MONITORING, ED   ____________________________________________  FINAL ASSESSMENT AND PLAN  Syncope  Plan: Patient with labs as dictated above. No clear etiology for his syncope. Family reports she's had for 5 syncopal events in the last 2 weeks. His EKG has changed compared to his prior EKG. He would benefit from monitoring for further evaluation. I will discuss with the hospitalist for admission. Patient was not dizzy or lightheaded ambulating to the ER room from the lobby.   Emily Filbert, MD   Emily Filbert, MD 06/18/15 1455  Emily Filbert, MD 06/18/15 [phone]

## 2015-06-18 NOTE — H&P (Signed)
[facility] [place] at [facility]   PATIENT NAME: Ruben Ruiz    MR#:  [id number]  DATE OF BIRTH:  Nov 21, 1917  DATE OF ADMISSION:  06/18/2015  PRIMARY CARE PHYSICIAN: Maryruth Hancock, MD   REQUESTING/REFERRING PHYSICIAN: Dr. Daryel November  CHIEF COMPLAINT:   Chief Complaint  Patient presents with  . Near Syncope    HISTORY OF PRESENT ILLNESS:  Ruben Ruiz  is a 82 y.o. male with a known history of prior CVA and TIAs without any residual neurological deficits, hypertension, diabetes and dementia presents from home secondary to a syncopal episode. Patient is unable to provide history due to his dementia, most of the history is obtained from his wife at bedside. According to her patient has been having unsteady gait but refuses to use any cane or walker. He has had falls 3 times in the last couple of months. This morning he was noted to be on the bathroom floor, he was alert when the family saw him. So they're not sure if he really passed out or had a fall. Blood pressure is elevated while in the emergency room. Patient denies any nausea, vomiting or fevers or chills.  PAST MEDICAL HISTORY:   Past Medical History  Diagnosis Date  . Dementia   . Hypertension   . Diabetes mellitus without complication (HCC)   . GERD (gastroesophageal reflux disease)     PAST SURGICAL HISTORY:  History reviewed. No pertinent past surgical history.  SOCIAL HISTORY:   Social History  Substance Use Topics  . Smoking status: Former Games developer  . Smokeless tobacco: Not on file  . Alcohol Use: No    FAMILY HISTORY:  No family history on file.  DRUG ALLERGIES:  No Known Allergies  REVIEW OF SYSTEMS:   Review of Systems  Unable to perform ROS: dementia    MEDICATIONS AT HOME:   Prior to Admission medications   Medication Sig Start Date End Date Taking? Authorizing Provider  aspirin EC 81 MG tablet Take 81 mg by mouth daily.   Yes Historical Provider, MD   glipiZIDE (GLUCOTROL XL) 10 MG 24 hr tablet Take 10 mg by mouth daily with breakfast.   Yes Historical Provider, MD  losartan (COZAAR) 100 MG tablet Take 100 mg by mouth at bedtime.    Yes Historical Provider, MD  metFORMIN (GLUCOPHAGE) 500 MG tablet Take 1,000 mg by mouth daily with breakfast.   Yes Historical Provider, MD  metoprolol succinate (TOPROL-XL) 50 MG 24 hr tablet Take 50 mg by mouth at bedtime. Take with or immediately following a meal.   Yes Historical Provider, MD  omeprazole (PRILOSEC) 20 MG capsule Take 40 mg by mouth at bedtime.    Yes Historical Provider, MD  pravastatin (PRAVACHOL) 20 MG tablet Take 20 mg by mouth at bedtime.    Yes Historical Provider, MD      VITAL SIGNS:  Blood pressure 184/98, pulse 72, temperature 97.8 F (36.6 C), temperature source Oral, resp. rate 18, height  (1.753 m), weight 68.04 kg (150 lb), SpO2 100 %.  PHYSICAL EXAMINATION:   Physical Exam  GENERAL:  82 y.o.-year-old patient lying in the bed with no acute distress.  EYES: Pupils equal, round, reactive to light and accommodation. No scleral icterus. Extraocular muscles intact.  HEENT: Head atraumatic, normocephalic. Oropharynx and nasopharynx clear.  NECK:  Supple, no jugular venous distention. No thyroid enlargement, no tenderness.  LUNGS: Normal breath sounds bilaterally, no wheezing, rales,rhonchi or crepitation. No use of accessory  muscles of respiration.  CARDIOVASCULAR: S1, S2 normal. No rubs, or gallops. 3/6 systolic murmur is present ABDOMEN: Soft, nontender, nondistended. Bowel sounds present. No organomegaly or mass.  EXTREMITIES: No pedal edema, cyanosis, or clubbing.  NEUROLOGIC: Cranial nerves II through XII are intact. Muscle strength 5/5 in all extremities. Sensation intact. Gait not checked.  PSYCHIATRIC: The patient is alert and oriented x 2. Pleasantly confused. Occasional confabulation noted  SKIN: No obvious rash, lesion, or ulcer.   LABORATORY PANEL:    CBC  Recent Labs Lab 06/18/15 1306  WBC 4.4  HGB 13.9  HCT 42.9  PLT 520*   ------------------------------------------------------------------------------------------------------------------  Chemistries   Recent Labs Lab 06/18/15 1306  NA 137  K 3.9  CL 101  CO2 28  GLUCOSE 228*  BUN 14  CREATININE 1.11  CALCIUM 9.4   ------------------------------------------------------------------------------------------------------------------  Cardiac Enzymes  Recent Labs Lab 06/18/15 1306  TROPONINI <0.03   ------------------------------------------------------------------------------------------------------------------  RADIOLOGY:  No results found.  EKG:   Orders placed or performed during the hospital encounter of 06/18/15  . ED EKG  . ED EKG    IMPRESSION AND PLAN:   Ruben Ruiz  is a 82 y.o. male with a known history of prior CVA and TIAs without any residual neurological deficits, hypertension, diabetes and dementia presents from home secondary to a syncopal episode.  #1 syncope/presyncope-we'll admit under observation. -Controlled his blood pressure. Orthostatic blood pressure checks. -Physical therapy consult. Carotid Dopplers and echocardiogram.  #2 hypertension-continue home medications including metoprolol, losartan  #3 diabetes mellitus-on metformin, glipizide and also placed on sliding scale insulin. Check a1c  #4 GERD-on Protonix  #5 DVT prophylaxis-Lovenox  Physical therapy and care management consults    All the records are reviewed and case discussed with ED provider. Management plans discussed with the patient, family and they are in agreement.  CODE STATUS: Full code  TOTAL TIME TAKING CARE OF THIS PATIENT: 50 minutes.    Enid Baas M.D on 06/18/2015 at 3:52 PM  Between 7am to 6pm - Pager - [phone]  After 6pm go to [email or web address] - password [email or web address] [facility] : [facility]  [facility]  Office   [phone]  CC: Primary care physician; Maryruth Hancock, MD

## 2015-06-18 NOTE — Care Management Obs Status (Signed)
MEDICARE OBSERVATION STATUS NOTIFICATION   Patient Details  Name: Ruben Ruiz MRN: [id number] Date of Birth: 08/19/1917   Medicare Observation Status Notification Given:  Yes    Caren Macadam, RN 06/18/2015, 4:14 PM

## 2015-06-18 NOTE — Progress Notes (Signed)
*  PRELIMINARY RESULTS* Echocardiogram 2D Echocardiogram has been performed.  Garrel Ridgel Stills 06/18/2015, 8:10 PM

## 2015-06-19 ENCOUNTER — Observation Stay: Payer: Medicare Other

## 2015-06-19 DIAGNOSIS — R55 Syncope and collapse: Secondary | ICD-10-CM | POA: Diagnosis not present

## 2015-06-19 LAB — BASIC METABOLIC PANEL
ANION GAP: 4 — AB (ref 5–15)
BUN: 15 mg/dL (ref 6–20)
CALCIUM: 9 mg/dL (ref 8.9–10.3)
CO2: 29 mmol/L (ref 22–32)
Chloride: 106 mmol/L (ref 101–111)
Creatinine, Ser: 1.1 mg/dL (ref 0.61–1.24)
GFR, EST NON AFRICAN AMERICAN: 55 mL/min — AB (ref >60)
GLUCOSE: 68 mg/dL (ref 65–99)
POTASSIUM: 4 mmol/L (ref 3.5–5.1)
SODIUM: 139 mmol/L (ref 135–145)

## 2015-06-19 LAB — GLUCOSE, CAPILLARY
GLUCOSE-CAPILLARY: 58 mg/dL — AB (ref 65–99)
GLUCOSE-CAPILLARY: 140 mg/dL — AB (ref 65–99)
GLUCOSE-CAPILLARY: 107 mg/dL — AB (ref 65–99)
Glucose-Capillary: 117 mg/dL — ABNORMAL HIGH (ref 65–99)
Glucose-Capillary: 117 mg/dL — ABNORMAL HIGH (ref 65–99)

## 2015-06-19 LAB — CBC
HEMATOCRIT: 37.5 % — AB (ref 40.0–52.0)
HEMOGLOBIN: 12.5 g/dL — AB (ref 13.0–18.0)
MCH: 28.9 pg (ref 26.0–34.0)
MCHC: 33.3 g/dL (ref 32.0–36.0)
MCV: 86.8 fL (ref 80.0–100.0)
Platelets: 477 10*3/uL — ABNORMAL HIGH (ref 150–440)
RBC: 4.32 MIL/uL — ABNORMAL LOW (ref 4.40–5.90)
RDW: 13.5 % (ref 11.5–14.5)
WBC: 4.5 10*3/uL (ref 3.8–10.6)

## 2015-06-19 LAB — OCCULT BLOOD X 1 CARD TO LAB, STOOL: FECAL OCCULT BLD: NEGATIVE

## 2015-06-19 LAB — TROPONIN I: Troponin I: 0.03 ng/mL (ref <0.031)

## 2015-06-19 NOTE — Progress Notes (Signed)
[facility] [place] at [facility]   PATIENT NAME: Ruben Ruiz    MR#:  [id number]  DATE OF BIRTH:  1917/12/25  SUBJECTIVE:  CHIEF COMPLAINT:   Chief Complaint  Patient presents with  . Near Syncope   patient as 82 year old African-American male with history of stroke, TIA, essential hypertension, diabetes, dementia who presents to the hospital after the patient's family found him on the floor. Because of concern of recurrent syncope. He was brought to emergency room for further evaluation and treatment and was admitted. No CT of head was yet performed. Patient denies any syncopal episodes of fall. He tells me that he sleepwalks and low as himself to sleep at nighttime. No bruising or injuries were noted on admission.   Review of Systems  Constitutional: Negative for fever, chills and weight loss.  HENT: Negative for congestion.   Eyes: Negative for blurred vision and double vision.  Respiratory: Negative for cough, sputum production, shortness of breath and wheezing.   Cardiovascular: Negative for chest pain, palpitations, orthopnea, leg swelling and PND.  Gastrointestinal: Negative for nausea, vomiting, abdominal pain, diarrhea, constipation and blood in stool.  Genitourinary: Negative for dysuria, urgency, frequency and hematuria.  Musculoskeletal: Negative for falls.  Neurological: Negative for dizziness, tremors, focal weakness and headaches.  Endo/Heme/Allergies: Does not bruise/bleed easily.  Psychiatric/Behavioral: Negative for depression. The patient does not have insomnia.     VITAL SIGNS: Blood pressure 153/80, pulse 63, temperature 97.6 F (36.4 C), temperature source Oral, resp. rate 18, height  (1.753 m), weight 60.374 kg (133 lb 1.6 oz), SpO2 97 %.  PHYSICAL EXAMINATION:   GENERAL:  82 y.o.-year-old patient lying in the bed with no acute distress.  EYES: Pupils equal, round, reactive to light and accommodation. No scleral icterus.  Extraocular muscles intact.  HEENT: Head atraumatic, normocephalic. Oropharynx and nasopharynx clear.  NECK:  Supple, no jugular venous distention. No thyroid enlargement, no tenderness.  LUNGS: Normal breath sounds bilaterally, no wheezing, rales,rhonchi or crepitation. No use of accessory muscles of respiration.  CARDIOVASCULAR: S1, S2 normal. No murmurs, rubs, or gallops.  ABDOMEN: Soft, nontender, nondistended. Bowel sounds present. No organomegaly or mass.  EXTREMITIES: No pedal edema, cyanosis, or clubbing.  NEUROLOGIC: Cranial nerves II through XII are intact. Muscle strength 5/5 in all extremities. Sensation intact. Gait not checked.  PSYCHIATRIC: The patient is alert and oriented x 3.  SKIN: No obvious rash, lesion, or ulcer.   ORDERS/RESULTS REVIEWED:   CBC  Recent Labs Lab 06/18/15 1306 06/19/15 0605  WBC 4.4 4.5  HGB 13.9 12.5*  HCT 42.9 37.5*  PLT 520* 477*  MCV 87.9 86.8  MCH 28.5 28.9  MCHC 32.4 33.3  RDW 13.5 13.5   ------------------------------------------------------------------------------------------------------------------  Chemistries   Recent Labs Lab 06/18/15 1306 06/19/15 0605  NA 137 139  K 3.9 4.0  CL 101 106  CO2 28 29  GLUCOSE 228* 68  BUN 14 15  CREATININE 1.11 1.10  CALCIUM 9.4 9.0   ------------------------------------------------------------------------------------------------------------------ estimated creatinine clearance is 34.3 mL/min (by C-G formula based on Cr of 1.1). ------------------------------------------------------------------------------------------------------------------  Recent Labs  06/18/15 1745  TSH 3.707    Cardiac Enzymes  Recent Labs Lab 06/18/15 1745 06/19/15 0007 06/19/15 0605  TROPONINI <0.03 0.03 <0.03   ------------------------------------------------------------------------------------------------------------------ Invalid input(s):  POCBNP ---------------------------------------------------------------------------------------------------------------  RADIOLOGY: US Carotid Bilateral  06/19/2015  CLINICAL DATA:  Syncope, stroke, diabetes EXAM: BILATERAL CAROTID DUPLEX ULTRASOUND TECHNIQUE: Wallace Cullens scale imaging, color Doppler and duplex ultrasound were performed of bilateral carotid  and vertebral arteries in the neck. COMPARISON:  02/18/2013 FINDINGS: Criteria: Quantification of carotid stenosis is based on velocity parameters that correlate the residual internal carotid diameter with NASCET-based stenosis levels, using the diameter of the distal internal carotid lumen as the denominator for stenosis measurement. The following velocity measurements were obtained: RIGHT ICA:  107/21 cm/sec CCA:  79/11 cm/sec SYSTOLIC ICA/CCA RATIO:  1.4 DIASTOLIC ICA/CCA RATIO:  1.9 ECA:  79 cm/sec LEFT ICA:  92/28 cm/sec CCA:  73/15 cm/sec SYSTOLIC ICA/CCA RATIO:  1.3 DIASTOLIC ICA/CCA RATIO:  1.8 ECA:  66 cm/sec RIGHT CAROTID ARTERY: Minor echogenic shadowing plaque formation. No hemodynamically significant right ICA stenosis, velocity elevation, or turbulent flow. Degree of narrowing less than 50%. RIGHT VERTEBRAL ARTERY:  Antegrade LEFT CAROTID ARTERY: Similar scattered minor echogenic plaque formation. No hemodynamically significant left ICA stenosis, velocity elevation, or turbulent flow. LEFT VERTEBRAL ARTERY:  Antegrade IMPRESSION: Mild carotid atherosclerosis. No hemodynamically significant ICA stenosis by ultrasound. Degree of narrowing less than 50% bilaterally. Patent antegrade vertebral flow bilaterally. Electronically Signed   By: Judie Petit.  Shick M.D.   On: 06/19/2015 10:03    EKG:  Orders placed or performed during the hospital encounter of 06/18/15  . ED EKG  . ED EKG    ASSESSMENT AND PLAN:  Active Problems:   Syncope  #1. Questionable syncope versus sleepwalking, patient denies any falls, syncope . Carotid ultrasound was unremarkable,  echocardiogram is pending as well as CT scan of the head. Awaiting for neurologist recommendations. Get physical therapist evaluation #2. Hypoglycemia in diabetic, concerning for hypoglycemia episodes at nighttime, getting hemoglobin A1c, decreasing glipizide dose to 5 mg once daily, following blood glucose levels closely #3. Hypertensive urgency, improved with supportive therapy, losartan #4 anemia. Get guaiac #5. Dementia, supportive therapy  Management plans discussed with the patient, family and they are in agreement.   DRUG ALLERGIES: No Known Allergies  CODE STATUS:     Code Status Orders        Start     Ordered   06/18/15 1736  Full code   Continuous     06/18/15 1736    Code Status History    Date Active Date Inactive Code Status Order ID Comments User Context   This patient has a current code status but no historical code status.    Advance Directive Documentation        Most Recent Value   Type of Advance Directive  Living will   Pre-existing out of facility DNR order (yellow form or pink MOST form)     "MOST" Form in Place?        TOTAL TIME TAKING CARE OF THIS PATIENT: 40 minutes.  Discussed with neurologist  Seira Cody M.D on 06/19/2015 at 12:34 PM  Between 7am to 6pm - Pager - [phone]  After 6pm go to [email or web address] - password [email or web address] [facility]  [facility]  Office  [phone]  CC: Primary care physician; Maryruth Hancock, MD

## 2015-06-19 NOTE — Evaluation (Signed)
Physical Therapy Evaluation Patient Details Name: Ruben Ruiz MRN: [id number] DOB: 10/22/1917 Today's Date: 06/19/2015   History of Present Illness  82yo black male found on floor in barthroom by wife, unclear as to whether there was a fall involved. PMH: HTN, DM, CVA & TIA (c complete resolution), dementia, and chronic non compliance c AD for AMB. Pbeing worked up for syncope, hoevere orthostatic vitals are unremarklable.   Clinical Impression  At evaluation, pt is received semirecumbent in bed upon entry without family present. The pt is sleep but awakened easily, and alert and willing to participate. No acute distress noted at this time. The pt is alert and oriented x4, pleasant, conversational, and following simple and multi-step commands consistently. Pt reports he is frustrated that everyone thinks he fell at home, reporting that he did not fall, but decided to lie down on the bathroom floor after waking in the middle of the night. Pt grip strength is strong and symmetrical; global strength as screened during functional mobility assessment presents with very mild impairment, only requiring supervision for safety for all functional mobility. Balance screening reveals a Berg score of 48/56, whereas the age matched norm for community dwelling men 28-89yo is 50-56 (no data available for 90+). Cognition is screened using the MoCA 7.2 showing a score of 16/30 indicative of a moderate cognitive impairment, scoring most poorly in areas of executive function, problem solving, and short term memory. Patient presenting with impairment of strength, balance,  and cognition, but is able to perform all functional mobility safely with modified indep to supervision. No additional PT services are needed at this time. Recommending supervision for all ambulation, and SLP services to further evaluate cognitive impairments and determine rehab potential.PT signing off.            Follow Up Recommendations No PT follow  up    Equipment Recommendations  None recommended by PT    Recommendations for Other Services       Precautions / Restrictions        Mobility  Bed Mobility Overal bed mobility: Independent                Transfers                    Ambulation/Gait Ambulation/Gait assistance: Supervision Ambulation Distance (Feet): 200 Feet Assistive device: None   Gait velocity: 0.37 Gait velocity interpretation: <1.8 ft/sec, indicative of risk for recurrent falls General Gait Details: occasional lateral instability with self correction, overall appears safe and stable,.   Stairs            Wheelchair Mobility    Modified Rankin (Stroke Patients Only)       Balance Overall balance assessment: History of Falls                               Standardized Balance Assessment Standardized Balance Assessment : Berg Balance Test Berg Balance Test Sit to Stand: Able to stand without using hands and stabilize independently Standing Unsupported: Able to stand safely 2 minutes Sitting with Back Unsupported but Feet Supported on Floor or Stool: Able to sit safely and securely 2 minutes Stand to Sit: Sits safely with minimal use of hands Transfers: Able to transfer safely, minor use of hands Standing Unsupported with Eyes Closed: Able to stand 10 seconds safely Standing Ubsupported with Feet Together: Able to place feet together independently and stand 1 minute safely From Standing,  Reach Forward with Outstretched Arm: Can reach confidently >25 cm (10") From Standing Position, Pick up Object from Floor: Able to pick up shoe safely and easily From Standing Position, Turn to Look Behind Over each Shoulder: Looks behind from both sides and weight shifts well Turn 360 Degrees: Able to turn 360 degrees safely in 4 seconds or less Standing Unsupported, Alternately Place Feet on Step/Stool: Able to complete 4 steps without aid or supervision Standing Unsupported,  One Foot in Front: Needs help to step but can hold 15 seconds Standing on One Leg: Tries to lift leg/unable to hold 3 seconds but remains standing independently Total Score: 48         Pertinent Vitals/Pain Pain Assessment: No/denies pain    Home Living Family/patient expects to be discharged to:: Private residence Living Arrangements: Spouse/significant other   Type of Home: House Home Access: Ramped entrance       Home Equipment: Grab bars - tub/shower;Walker - 2 wheels Additional Comments: Unable to get additonal information at this time, no caregiver available. RN reports pt is a poor historian. Pt is very functional at eval.     Prior Function           Comments: Unable to get additonal information at this time, no caregiver available. RN reports pt is a poor historian. Pt is very functional at eval.      Hand Dominance        Extremity/Trunk Assessment   Upper Extremity Assessment: Overall WFL for tasks assessed           Lower Extremity Assessment: Overall WFL for tasks assessed         Communication   Communication: HOH  Cognition Arousal/Alertness: Awake/alert Behavior During Therapy: WFL for tasks assessed/performed Overall Cognitive Status: No family/caregiver present to determine baseline cognitive functioning Area of Impairment: Attention;Memory;Problem solving;Following commands   Current Attention Level: Sustained;Focused Memory: Decreased short-term memory Following Commands: Follows one step commands consistently;Follows multi-step commands with increased time     Problem Solving: Slow processing General Comments: MoCA 7.2: 16/30 = Moderate cognitive impairment. impaired executive function, short term memory, and attention.     General Comments      Exercises        Assessment/Plan    PT Assessment Patent does not need any further PT services  PT Diagnosis     PT Problem List    PT Treatment Interventions     PT Goals  (Current goals can be found in the Care Plan section) Acute Rehab PT Goals PT Goal Formulation: All assessment and education complete, DC therapy    Frequency     Barriers to discharge        Co-evaluation               End of Session Equipment Utilized During Treatment: Gait belt Activity Tolerance: No increased pain Patient left: in bed;with call bell/phone within reach;with bed alarm set;with SCD's reapplied      Functional Limitation: Mobility: Walking and moving around Mobility: Walking and Moving Around Current Status (Z6109): At least 1 percent but less than 20 percent impaired, limited or restricted Mobility: Walking and Moving Around Goal Status [phone]): At least 1 percent but less than 20 percent impaired, limited or restricted Mobility: Walking and Moving Around Discharge Status [phone]): At least 1 percent but less than 20 percent impaired, limited or restricted    Time: 1343-1411 PT Time Calculation (min) (ACUTE ONLY): 28 min   Charges:   PT  Evaluation $PT Eval Low Complexity: 1 Procedure PT Treatments $Therapeutic Activity: 8-22 mins   PT G Codes:   PT G-Codes **NOT FOR INPATIENT CLASS** Functional Limitation: Mobility: Walking and moving around Mobility: Walking and Moving Around Current Status (Z6109): At least 1 percent but less than 20 percent impaired, limited or restricted Mobility: Walking and Moving Around Goal Status [phone]): At least 1 percent but less than 20 percent impaired, limited or restricted Mobility: Walking and Moving Around Discharge Status [phone]): At least 1 percent but less than 20 percent impaired, limited or restricted   2:41 PM, 06/19/2015 Rosamaria Lints, PT, DPT PRN Physical Therapist at [facility] [place] License # [id number] [phone] (mobile)

## 2015-06-19 NOTE — Consult Note (Signed)
Reason for Consult:Fall Referring Physician: Winona Legato  CC: Fall  HPI: Ruben Ruiz is an 82 y.o. male with a history of dementia and multiple falls who was found on the floor.  Patient was not unresponsive at the time.  Unclear if patient had a syncopal spell or just fell.  Patient reports that he was unable to get off the toilet and slid down to the floor.  Patient's history is not reliable due to his dementia.  He can not give any further history about whether there were any associated symptoms.    Past Medical History  Diagnosis Date  . Dementia   . Hypertension   . Diabetes mellitus without complication (HCC)   . GERD (gastroesophageal reflux disease)     History reviewed. No pertinent past surgical history.  Family history: Unable to obtain secondary to dementia  Social History:  reports that he has quit smoking. He does not have any smokeless tobacco history on file. He reports that he does not drink alcohol. His drug history is not on file.  No Known Allergies  Medications:  I have reviewed the patient's current medications. Prior to Admission:  Prescriptions prior to admission  Medication Sig Dispense Refill Last Dose  . aspirin EC 81 MG tablet Take 81 mg by mouth daily.   06/17/2015 at 1300  . glipiZIDE (GLUCOTROL XL) 10 MG 24 hr tablet Take 10 mg by mouth daily with breakfast.   06/17/2015 at Unknown time  . losartan (COZAAR) 100 MG tablet Take 100 mg by mouth at bedtime.    06/17/2015 at Unknown time  . metFORMIN (GLUCOPHAGE) 500 MG tablet Take 1,000 mg by mouth daily with breakfast.   06/17/2015 at Unknown time  . metoprolol succinate (TOPROL-XL) 50 MG 24 hr tablet Take 50 mg by mouth at bedtime. Take with or immediately following a meal.   06/17/2015 at 1900  . omeprazole (PRILOSEC) 20 MG capsule Take 40 mg by mouth at bedtime.    06/17/2015 at Unknown time  . pravastatin (PRAVACHOL) 20 MG tablet Take 20 mg by mouth at bedtime.    06/17/2015 at Unknown time   Scheduled: .  aspirin EC  81 mg Oral Daily  . enoxaparin (LOVENOX) injection  40 mg Subcutaneous Q24H  . glipiZIDE  10 mg Oral Q breakfast  . insulin aspart  0-5 Units Subcutaneous QHS  . insulin aspart  0-9 Units Subcutaneous TID WC  . losartan  100 mg Oral QHS  . metFORMIN  1,000 mg Oral Q breakfast  . metoprolol succinate  50 mg Oral QHS  . pantoprazole  40 mg Oral QAC breakfast  . pravastatin  20 mg Oral QHS  . sodium chloride flush  3 mL Intravenous Q12H    ROS: History obtained from the patient  General ROS: negative for - chills, fatigue, fever, night sweats, weight gain or weight loss Psychological ROS: negative for - behavioral disorder, hallucinations, memory difficulties, mood swings or suicidal ideation Ophthalmic ROS: negative for - blurry vision, double vision, eye pain or loss of vision ENT ROS: negative for - epistaxis, nasal discharge, oral lesions, sore throat, tinnitus or vertigo Allergy and Immunology ROS: negative for - hives or itchy/watery eyes Hematological and Lymphatic ROS: negative for - bleeding problems, bruising or swollen lymph nodes Endocrine ROS: negative for - galactorrhea, hair pattern changes, polydipsia/polyuria or temperature intolerance Respiratory ROS: negative for - cough, hemoptysis, shortness of breath or wheezing Cardiovascular ROS: negative for - chest pain, dyspnea on exertion, edema or irregular  heartbeat Gastrointestinal ROS: negative for - abdominal pain, diarrhea, hematemesis, nausea/vomiting or stool incontinence Genito-Urinary ROS: negative for - dysuria, hematuria, incontinence or urinary frequency/urgency Musculoskeletal ROS: negative for - joint swelling or muscular weakness Neurological ROS: as noted in HPI Dermatological ROS: negative for rash and skin lesion changes  Physical Examination: Blood pressure 153/80, pulse 63, temperature 97.6 F (36.4 C), temperature source Oral, resp. rate 18, height  (1.753 m), weight 60.374 kg (133 lb  1.6 oz), SpO2 97 %.  HEENT-  Normocephalic, no lesions, without obvious abnormality.  Normal external eye and conjunctiva.  Normal TM's bilaterally.  Normal auditory canals and external ears. Normal external nose, mucus membranes and septum.  Normal pharynx. Cardiovascular- S1, S2 normal, pulses palpable throughout   Lungs- chest clear, no wheezing, rales, normal symmetric air entry Abdomen- soft, non-tender; bowel sounds normal; no masses,  no organomegaly Extremities- no edema Lymph-no adenopathy palpable Musculoskeletal-no joint tenderness, deformity or swelling Skin-warm and dry, no hyperpigmentation, vitiligo, or suspicious lesions  Neurological Examination Mental Status: Alert, oriented to name and place.  Does not give accurate history.  Speech fluent without evidence of aphasia.  Able to follow 2-3 step commands without difficulty. Cranial Nerves: II: Discs flat bilaterally; Visual fields grossly normal, pupils equal, round, reactive to light and accommodation III,IV, VI: ptosis not present, extra-ocular motions intact bilaterally V,VII: smile symmetric, facial light touch sensation normal bilaterally VIII: hearing normal bilaterally IX,X: gag reflex present XI: bilateral shoulder shrug XII: midline tongue extension Motor: Right : Upper extremity   5/5    Left:     Upper extremity   5/5  Lower extremity   5/5     Lower extremity   5/5 Tone and bulk:normal tone throughout; no atrophy noted Sensory: Pinprick and light touch intact throughout, bilaterally Deep Tendon Reflexes: 2+ in the upper extremities, 1+ at the knees and absent at the ankles.   Plantars: Right: upgoing   Left: upgoing Cerebellar: Normal finger-to-nose and normal heel-to-shin testing bilaterally Gait: not tested due to safety concerns      Laboratory Studies:   Basic Metabolic Panel:  Recent Labs Lab 06/18/15 1306 06/19/15 0605  NA 137 139  K 3.9 4.0  CL 101 106  CO2 28 29  GLUCOSE 228* 68   BUN 14 15  CREATININE 1.11 1.10  CALCIUM 9.4 9.0    Liver Function Tests: No results for input(s): AST, ALT, ALKPHOS, BILITOT, PROT, ALBUMIN in the last 168 hours. No results for input(s): LIPASE, AMYLASE in the last 168 hours. No results for input(s): AMMONIA in the last 168 hours.  CBC:  Recent Labs Lab 06/18/15 1306 06/19/15 0605  WBC 4.4 4.5  HGB 13.9 12.5*  HCT 42.9 37.5*  MCV 87.9 86.8  PLT 520* 477*    Cardiac Enzymes:  Recent Labs Lab 06/18/15 1306 06/18/15 1745 06/19/15 0007 06/19/15 0605  TROPONINI <0.03 <0.03 0.03 <0.03    BNP: Invalid input(s): POCBNP  CBG:  Recent Labs Lab 06/18/15 1738 06/18/15 2157 06/19/15 0727 06/19/15 0810 06/19/15 1124  GLUCAP 124* 154* 58* 117* 140*    Microbiology: No results found for this or any previous visit.  Coagulation Studies: No results for input(s): LABPROT, INR in the last 72 hours.  Urinalysis:  Recent Labs Lab 06/18/15 1459  COLORURINE YELLOW*  LABSPEC 1.021  PHURINE 5.0  GLUCOSEU >500*  HGBUR 1+*  BILIRUBINUR NEGATIVE  KETONESUR TRACE*  PROTEINUR NEGATIVE  NITRITE NEGATIVE  LEUKOCYTESUR NEGATIVE    Lipid Panel:  Component Value Date/Time   CHOL 179 02/18/2013 1118   TRIG 66 02/18/2013 1118   HDL 77* 02/18/2013 1118   VLDL 13 02/18/2013 1118   LDLCALC 89 02/18/2013 1118    HgbA1C:  Lab Results  Component Value Date   HGBA1C 5.1 06/18/2015    Urine Drug Screen:  No results found for: LABOPIA, COCAINSCRNUR, LABBENZ, AMPHETMU, THCU, LABBARB  Alcohol Level: No results for input(s): ETH in the last 168 hours.  Other results: EKG: sinus rhythm at 66 bpm, occasional PVC's.  Imaging: US Carotid Bilateral  06/19/2015  CLINICAL DATA:  Syncope, stroke, diabetes EXAM: BILATERAL CAROTID DUPLEX ULTRASOUND TECHNIQUE: Wallace Cullens scale imaging, color Doppler and duplex ultrasound were performed of bilateral carotid and vertebral arteries in the neck. COMPARISON:  02/18/2013 FINDINGS:  Criteria: Quantification of carotid stenosis is based on velocity parameters that correlate the residual internal carotid diameter with NASCET-based stenosis levels, using the diameter of the distal internal carotid lumen as the denominator for stenosis measurement. The following velocity measurements were obtained: RIGHT ICA:  107/21 cm/sec CCA:  79/11 cm/sec SYSTOLIC ICA/CCA RATIO:  1.4 DIASTOLIC ICA/CCA RATIO:  1.9 ECA:  79 cm/sec LEFT ICA:  92/28 cm/sec CCA:  73/15 cm/sec SYSTOLIC ICA/CCA RATIO:  1.3 DIASTOLIC ICA/CCA RATIO:  1.8 ECA:  66 cm/sec RIGHT CAROTID ARTERY: Minor echogenic shadowing plaque formation. No hemodynamically significant right ICA stenosis, velocity elevation, or turbulent flow. Degree of narrowing less than 50%. RIGHT VERTEBRAL ARTERY:  Antegrade LEFT CAROTID ARTERY: Similar scattered minor echogenic plaque formation. No hemodynamically significant left ICA stenosis, velocity elevation, or turbulent flow. LEFT VERTEBRAL ARTERY:  Antegrade IMPRESSION: Mild carotid atherosclerosis. No hemodynamically significant ICA stenosis by ultrasound. Degree of narrowing less than 50% bilaterally. Patent antegrade vertebral flow bilaterally. Electronically Signed   By: Judie Petit.  Shick M.D.   On: 06/19/2015 10:03     Assessment/Plan: 82 year old male with dementia found on the floor.  Unclear if there was true syncope.  TIA and seizure remain on the differential as well.  Patient denies losing consciousness but may not be reliable.  Patient not orthostatic.  Echocardiogram shows an EF of 55-60% with no cardiac source of emboli.  Carotid dopplers show no hemodynamically significant stenosis.  Patient on ASA at home.  Has a history of stroke/TIA.     Recommendations: 1. Head CT without contrast.  If unremarkable, MRI of the brain without contrast should be performed.  2. EEG.  May be performed as an outpatient.  Anticonvulsant therapy not indicated at this time.    Thana Farr,  MD Neurology [phone] 06/19/2015, 1:42 PM

## 2015-06-19 NOTE — Progress Notes (Signed)
CBG this AM resulted at 58. Patient was asymptomatic, stating he feels fine and had no idea his blood sugar was low. Gave patient orange juice. CBG came up to 117.

## 2015-06-20 ENCOUNTER — Observation Stay: Payer: Medicare Other

## 2015-06-20 DIAGNOSIS — E11649 Type 2 diabetes mellitus with hypoglycemia without coma: Secondary | ICD-10-CM

## 2015-06-20 DIAGNOSIS — D649 Anemia, unspecified: Secondary | ICD-10-CM

## 2015-06-20 DIAGNOSIS — R55 Syncope and collapse: Secondary | ICD-10-CM | POA: Diagnosis not present

## 2015-06-20 DIAGNOSIS — I160 Hypertensive urgency: Secondary | ICD-10-CM

## 2015-06-20 LAB — GLUCOSE, CAPILLARY
GLUCOSE-CAPILLARY: 182 mg/dL — AB (ref 65–99)
Glucose-Capillary: 88 mg/dL (ref 65–99)

## 2015-06-20 MED ORDER — GLIPIZIDE ER 5 MG PO TB24: 5.0000 mg | ORAL_TABLET | Freq: Every day | ORAL | Status: DC

## 2015-06-20 NOTE — Progress Notes (Signed)
Pt alert and oriented x2, no complaints of pain or discomfort.  Bed in low position, call bell within reach.  Bed alarms on and functioning.  Assessment done and charted.  Will continue to monitor and do hourly rounding throughout the shift 

## 2015-06-20 NOTE — Discharge Summary (Signed)
[facility] - [place] [facility]   PATIENT NAME: Ruben Ruiz    MR#:  [id number]  DATE OF BIRTH:  31-May-1917  DATE OF ADMISSION:  06/18/2015 ADMITTING PHYSICIAN: Enid Baas, MD  DATE OF DISCHARGE: No discharge date for patient encounter.  PRIMARY CARE PHYSICIAN: Maryruth Hancock, MD     ADMISSION DIAGNOSIS:  Syncope [R55] Syncope, unspecified syncope type [R55]  DISCHARGE DIAGNOSIS:  Principal Problem:   Syncope Active Problems:   Hypertensive urgency   Hypoglycemia associated with diabetes (HCC)   Anemia   SECONDARY DIAGNOSIS:   Past Medical History  Diagnosis Date  . Dementia   . Hypertension   . Diabetes mellitus without complication (HCC)   . GERD (gastroesophageal reflux disease)     .pro HOSPITAL COURSE:  Patient is 82 year old African-American male with past medical history significant for  dementia and history of multiple falls in the past who was brought to emergency room because he was found on the floor. No unresponsiveness or syncope. Patient reported that he was unable to get off the toilet and slid down to the floor. He was admitted to the hospital for further evaluation. His initial labs were remarkable for hyperglycemia, hemoglobin A1c was found to be 5.1, otherwise labs were unremarkable,  including troponin 3. Urinalysis was normal. Fecal occult blood study was negative.  Patient's CT of head revealed progressive atrophy and chronic small vessel disease, stable old left corona radiata and basal ganglia lacunar infarct, no acute abnormality. Carotid ultrasound revealed mild carotid atherosclerosis but no hematemesis significant ICA stenosis, antegrade flow was noted in both vertebral arteries. MRI of brain was performed and it showed atrophy and chronic ischemic changes but no acute infarct. Patient was seen by neurologist, Dr. Thad Ranger, recommended to get an electroencephalogram as outpatient, but no anticonvulsive therapy was  indicated, she also recommended to continue aspirin therapy. Of note, while in the hospital patient was noted to be hypoglycemic with blood glucose level drifting down to 68, and since patient's hemoglobin A1c was found to be low at 5.1, it was felt that doses of the patient's diabetic medications  should be decreased.   Discussion by problem #1. Questionable syncope versus sleepwalking, patient denies any falls or  syncope . Carotid ultrasound was unremarkable, echocardiogram was normal as well as CT scan of the head, brain MRI. Patient was seen by neurologist and recommended aspirin therapy, an electroencephalogram as outpatient. Physical therapist evaluated patient and recommended no physical therapist follow-up.  #2. Hypoglycemia in diabetic, concerning for intermittent hypoglycemia episodes ,  hemoglobin A1c was 5.1, decrease glipizide XL dose to 5 mg daily, follow blood glucose levels as outpatient closely #3. Hypertensive urgency, improved with supportive therapy, losartan #4 anemia. Negative guaiac, decrease in hemoglobin level, likely due to do rehydration #5. Dementia, supportive therapy   DISCHARGE CONDITIONS:   Stable  CONSULTS OBTAINED:  Treatment Team:  Thana Farr, MD  DRUG ALLERGIES:  No Known Allergies  DISCHARGE MEDICATIONS:   Current Discharge Medication List    CONTINUE these medications which have CHANGED   Details  glipiZIDE (GLIPIZIDE XL) 5 MG 24 hr tablet Take 1 tablet (5 mg total) by mouth daily with breakfast. Qty: 30 tablet, Refills: 6      CONTINUE these medications which have NOT CHANGED   Details  aspirin EC 81 MG tablet Take 81 mg by mouth daily.    losartan (COZAAR) 100 MG tablet Take 100 mg by mouth at bedtime.     metFORMIN (GLUCOPHAGE)  500 MG tablet Take 1,000 mg by mouth daily with breakfast.    metoprolol succinate (TOPROL-XL) 50 MG 24 hr tablet Take 50 mg by mouth at bedtime. Take with or immediately following a meal.    omeprazole  (PRILOSEC) 20 MG capsule Take 40 mg by mouth at bedtime.     pravastatin (PRAVACHOL) 20 MG tablet Take 20 mg by mouth at bedtime.          DISCHARGE INSTRUCTIONS:    Patient is to follow-up with primary care physician as outpatient, an electroencephalogram is recommended as outpatient as well  If you experience worsening of your admission symptoms, develop shortness of breath, life threatening emergency, suicidal or homicidal thoughts you must seek medical attention immediately by calling 911 or calling your MD immediately  if symptoms less severe.  You Must read complete instructions/literature along with all the possible adverse reactions/side effects for all the Medicines you take and that have been prescribed to you. Take any new Medicines after you have completely understood and accept all the possible adverse reactions/side effects.   Please note  You were cared for by a hospitalist during your hospital stay. If you have any questions about your discharge medications or the care you received while you were in the hospital after you are discharged, you can call the unit and asked to speak with the hospitalist on call if the hospitalist that took care of you is not available. Once you are discharged, your primary care physician will handle any further medical issues. Please note that NO REFILLS for any discharge medications will be authorized once you are discharged, as it is imperative that you return to your primary care physician (or establish a relationship with a primary care physician if you do not have one) for your aftercare needs so that they can reassess your need for medications and monitor your lab values.    Today   CHIEF COMPLAINT:   Chief Complaint  Patient presents with  . Near Syncope    HISTORY OF PRESENT ILLNESS:  Ruben Ruiz  is a 82 y.o. male with a known history of dementia and history of multiple falls in the past who was brought to emergency room because  he was found on the floor. No unresponsiveness or syncope. Patient reported that he was unable to get off the toilet and slid down to the floor. He was admitted to the hospital for further evaluation. His initial labs were remarkable for hyperglycemia, hemoglobin A1c was found to be 5.1, otherwise labs were unremarkable,  including troponin 3. Urinalysis was normal. Fecal occult blood study was negative.  Patient's CT of head revealed progressive atrophy and chronic small vessel disease, stable old left corona radiata and basal ganglia lacunar infarct, no acute abnormality. Carotid ultrasound revealed mild carotid atherosclerosis but no hematemesis significant ICA stenosis, antegrade flow was noted in both vertebral arteries. MRI of brain was performed and it showed atrophy and chronic ischemic changes but no acute infarct. Patient was seen by neurologist, Dr. Thad Ranger, recommended to get an electroencephalogram as outpatient, but no anticonvulsive therapy was indicated, she also recommended to continue aspirin therapy. Of note, while in the hospital patient was noted to be hypoglycemic with blood glucose level drifting down to 68, and since patient's hemoglobin A1c was found to be low at 5.1, it was felt that doses of the patient's diabetic medications  should be decreased.   Discussion by problem #1. Questionable syncope versus sleepwalking, patient denies any falls or  syncope . Carotid ultrasound was unremarkable, echocardiogram was normal as well as CT scan of the head, brain MRI. Patient was seen by neurologist and recommended aspirin therapy, an electroencephalogram as outpatient. Physical therapist evaluated patient and recommended no physical therapist follow-up.  #2. Hypoglycemia in diabetic, concerning for intermittent hypoglycemia episodes ,  hemoglobin A1c was 5.1, decrease glipizide XL dose to 5 mg daily, follow blood glucose levels as outpatient closely #3. Hypertensive urgency, improved with  supportive therapy, losartan #4 anemia. Negative guaiac, decrease in hemoglobin level, likely due to do rehydration #5. Dementia, supportive therapy    VITAL SIGNS:  Blood pressure 126/93, pulse 64, temperature 97.5 F (36.4 C), temperature source Oral, resp. rate 18, height 5\' 9"  (1.753 m), weight 60.374 kg (133 lb 1.6 oz), SpO2 100 %.  I/O:   Intake/Output Summary (Last 24 hours) at 06/20/15 1356 Last data filed at 06/20/15 0749  Gross per 24 hour  Intake     50 ml  Output      0 ml  Net     50 ml    PHYSICAL EXAMINATION:  GENERAL:  82 y.o.-year-old patient lying in the bed with no acute distress.  EYES: Pupils equal, round, reactive to light and accommodation. No scleral icterus. Extraocular muscles intact.  HEENT: Head atraumatic, normocephalic. Oropharynx and nasopharynx clear.  NECK:  Supple, no jugular venous distention. No thyroid enlargement, no tenderness.  LUNGS: Normal breath sounds bilaterally, no wheezing, rales,rhonchi or crepitation. No use of accessory muscles of respiration.  CARDIOVASCULAR: S1, S2 normal. No murmurs, rubs, or gallops.  ABDOMEN: Soft, non-tender, non-distended. Bowel sounds present. No organomegaly or mass.  EXTREMITIES: No pedal edema, cyanosis, or clubbing.  NEUROLOGIC: Cranial nerves II through XII are intact. Muscle strength 5/5 in all extremities. Sensation intact. Gait not checked.  PSYCHIATRIC: The patient is alert and oriented x 3.  SKIN: No obvious rash, lesion, or ulcer.   DATA REVIEW:   CBC  Recent Labs Lab 06/19/15 0605  WBC 4.5  HGB 12.5*  HCT 37.5*  PLT 477*    Chemistries   Recent Labs Lab 06/19/15 0605  NA 139  K 4.0  CL 106  CO2 29  GLUCOSE 68  BUN 15  CREATININE 1.10  CALCIUM 9.0    Cardiac Enzymes  Recent Labs Lab 06/19/15 0605  TROPONINI <0.03    Microbiology Results  No results found for this or any previous visit.  RADIOLOGY:  Dg Eye Foreign Body  06/20/2015  CLINICAL DATA:  Metal  working/exposure; clearance prior to MRI EXAM: ORBITS FOR FOREIGN BODY - 2 VIEW COMPARISON:  06/19/2015 head CT . FINDINGS: There is no evidence of metallic foreign body within the orbits. No significant bone abnormality identified. IMPRESSION: No evidence of metallic foreign body within the orbits. Electronically Signed   By: Delbert Phenix M.D.   On: 06/20/2015 10:27   Ct Head Wo Contrast  06/19/2015  CLINICAL DATA:  Syncopal episode resulting in a fall.  Dementia. EXAM: CT HEAD WITHOUT CONTRAST TECHNIQUE: Contiguous axial images were obtained from the base of the skull through the vertex without intravenous contrast. COMPARISON:  Previous examinations, the most recent dated 01/10/2014. FINDINGS: Diffusely enlarged ventricles and subarachnoid spaces. Patchy white matter low density in both cerebral hemispheres. Old left corona radiata and basal ganglia lacunar infarct. No skull fracture, intracranial hemorrhage, mass lesion, CT evidence of acute infarction or paranasal sinus air-fluid levels. There are chronic degenerative changes and fragmentation of the anterior arch of C1, unchanged compared  to multiple previous examinations. IMPRESSION: 1. No acute abnormality. 2. Mildly progressive atrophy and chronic small vessel white matter ischemic changes. 3. Stable old left corona radiata and basal ganglia lacunar infarct. Electronically Signed   By: Beckie Salts M.D.   On: 06/19/2015 15:09   Mr Brain Wo Contrast  06/20/2015  CLINICAL DATA:  Near syncope.  Recent falls. EXAM: MRI HEAD WITHOUT CONTRAST TECHNIQUE: Multiplanar, multiecho pulse sequences of the brain and surrounding structures were obtained without intravenous contrast. COMPARISON:  CT 06/19/2015 FINDINGS: Moderate to advanced atrophy. Mild ventricular enlargement consistent with atrophy. Negative for hydrocephalus. Negative for acute infarct. Chronic microvascular ischemic changes in the white matter. Brainstem and cerebellum intact. Negative for  intracranial hemorrhage. Negative for mass or edema. No shift of the midline structures. Circle Anne Hahn is patent. Normal skull base. Paranasal sinuses clear. Pituitary not enlarged Advanced cervical degenerative change diffusely. IMPRESSION: No acute infarct Atrophy and chronic ischemic change. Electronically Signed   By: Marlan Palau M.D.   On: 06/20/2015 11:11   US Carotid Bilateral  06/19/2015  CLINICAL DATA:  Syncope, stroke, diabetes EXAM: BILATERAL CAROTID DUPLEX ULTRASOUND TECHNIQUE: Wallace Cullens scale imaging, color Doppler and duplex ultrasound were performed of bilateral carotid and vertebral arteries in the neck. COMPARISON:  02/18/2013 FINDINGS: Criteria: Quantification of carotid stenosis is based on velocity parameters that correlate the residual internal carotid diameter with NASCET-based stenosis levels, using the diameter of the distal internal carotid lumen as the denominator for stenosis measurement. The following velocity measurements were obtained: RIGHT ICA:  107/21 cm/sec CCA:  79/11 cm/sec SYSTOLIC ICA/CCA RATIO:  1.4 DIASTOLIC ICA/CCA RATIO:  1.9 ECA:  79 cm/sec LEFT ICA:  92/28 cm/sec CCA:  73/15 cm/sec SYSTOLIC ICA/CCA RATIO:  1.3 DIASTOLIC ICA/CCA RATIO:  1.8 ECA:  66 cm/sec RIGHT CAROTID ARTERY: Minor echogenic shadowing plaque formation. No hemodynamically significant right ICA stenosis, velocity elevation, or turbulent flow. Degree of narrowing less than 50%. RIGHT VERTEBRAL ARTERY:  Antegrade LEFT CAROTID ARTERY: Similar scattered minor echogenic plaque formation. No hemodynamically significant left ICA stenosis, velocity elevation, or turbulent flow. LEFT VERTEBRAL ARTERY:  Antegrade IMPRESSION: Mild carotid atherosclerosis. No hemodynamically significant ICA stenosis by ultrasound. Degree of narrowing less than 50% bilaterally. Patent antegrade vertebral flow bilaterally. Electronically Signed   By: Judie Petit.  Shick M.D.   On: 06/19/2015 10:03    EKG:   Orders placed or performed during  the hospital encounter of 06/18/15  . ED EKG  . ED EKG      Management plans discussed with the patient, family and they are in agreement.  CODE STATUS:     Code Status Orders        Start     Ordered   06/18/15 1736  Full code   Continuous     06/18/15 1736    Code Status History    Date Active Date Inactive Code Status Order ID Comments User Context   This patient has a current code status but no historical code status.    Advance Directive Documentation        Most Recent Value   Type of Advance Directive  Living will   Pre-existing out of facility DNR order (yellow form or pink MOST form)     "MOST" Form in Place?        TOTAL TIME TAKING CARE OF THIS PATIENT: 40  minutes.    Katharina Caper M.D on 06/20/2015 at 1:56 PM  Between 7am to 6pm - Pager - [phone]  After 6pm go  to [email or web address] - password [email or web address] [facility]  [place] [facility]  Office  [phone]  CC: Primary care physician; Maryruth Hancock, MD

## 2015-06-20 NOTE — Plan of Care (Signed)
Problem: Safety: Goal: Ability to remain free from injury will improve Outcome: Progressing Bed alarm in use, hourly rounding done. Pt free from falls.  Problem: Health Behavior/Discharge Planning: Goal: Ability to manage health-related needs will improve Outcome: Progressing Pt with dementia, family support system to be included in d/c teaching.  Problem: Physical Regulation: Goal: Ability to maintain clinical measurements within normal limits will improve Outcome: Progressing Pt with bathroom privileges, pt with steady gait. Stand by assist/monitor. Goal: Will remain free from infection Outcome: Progressing Pt afebrile, will cont to monitor WBC.  Problem: Skin Integrity: Goal: Risk for impaired skin integrity will decrease Outcome: Progressing Pt able to turn self in bed.  Problem: Tissue Perfusion: Goal: Risk factors for ineffective tissue perfusion will decrease Outcome: Progressing Cont lovenox per md order.   Problem: Activity: Goal: Risk for activity intolerance will decrease Outcome: Progressing Pt with ambulation in room and bathroom privileges.   Problem: Fluid Volume: Goal: Ability to maintain a balanced intake and output will improve Outcome: Progressing Encourage PO intake.  Problem: Bowel/Gastric: Goal: Will not experience complications related to bowel motility Outcome: Progressing OB negative x 1.

## 2015-06-20 NOTE — Progress Notes (Signed)
Subjective: Patient stable.  No new complaints.  No further falls or syncope.    Objective: Current vital signs: BP 125/88 mmHg  Pulse 67  Temp(Src) 97.2 F (36.2 C) (Oral)  Resp 18  Ht  (1.753 m)  Wt 60.374 kg (133 lb 1.6 oz)  BMI 19.65 kg/m2  SpO2 99% Vital signs in last 24 hours: Temp:  [97.2 F (36.2 C)-98.2 F (36.8 C)] 97.2 F (36.2 C) (02/19 1400) Pulse Rate:  [62-70] 67 (02/19 1400) Resp:  [18-20] 18 (02/19 1400) BP: (125-150)/(70-93) 125/88 mmHg (02/19 1400) SpO2:  [96 %-100 %] 99 % (02/19 1400)  Intake/Output from previous day: 02/18 0701 - 02/19 0700 In: 293 [P.O.:290; I.V.:3] Out: -  Intake/Output this shift:   Nutritional status: Diet 2 gram sodium Room service appropriate?: Yes; Fluid consistency:: Thin Diet - low sodium heart healthy  Neurologic Exam: Mental Status: Alert, oriented to name and place. Does not give accurate history. Speech fluent without evidence of aphasia. Able to follow 2-3 step commands without difficulty. Cranial Nerves: II: Discs flat bilaterally; Visual fields grossly normal, pupils equal, round, reactive to light and accommodation III,IV, VI: ptosis not present, extra-ocular motions intact bilaterally V,VII: smile symmetric, facial light touch sensation normal bilaterally VIII: hearing normal bilaterally IX,X: gag reflex present XI: bilateral shoulder shrug XII: midline tongue extension Motor: 5/5 throughout  Lab Results: Basic Metabolic Panel:  Recent Labs Lab 06/18/15 1306 06/19/15 0605  NA 137 139  K 3.9 4.0  CL 101 106  CO2 28 29  GLUCOSE 228* 68  BUN 14 15  CREATININE 1.11 1.10  CALCIUM 9.4 9.0    Liver Function Tests: No results for input(s): AST, ALT, ALKPHOS, BILITOT, PROT, ALBUMIN in the last 168 hours. No results for input(s): LIPASE, AMYLASE in the last 168 hours. No results for input(s): AMMONIA in the last 168 hours.  CBC:  Recent Labs Lab 06/18/15 1306 06/19/15 0605  WBC 4.4 4.5   HGB 13.9 12.5*  HCT 42.9 37.5*  MCV 87.9 86.8  PLT 520* 477*    Cardiac Enzymes:  Recent Labs Lab 06/18/15 1306 06/18/15 1745 06/19/15 0007 06/19/15 0605  TROPONINI <0.03 <0.03 0.03 <0.03    Lipid Panel: No results for input(s): CHOL, TRIG, HDL, CHOLHDL, VLDL, LDLCALC in the last 168 hours.  CBG:  Recent Labs Lab 06/19/15 1124 06/19/15 1636 06/19/15 2039 06/20/15 0722 06/20/15 1122  GLUCAP 140* 107* 117* 88 182*    Microbiology: No results found for this or any previous visit.  Coagulation Studies: No results for input(s): LABPROT, INR in the last 72 hours.  Imaging: Dg Eye Foreign Body  06/20/2015  CLINICAL DATA:  Metal working/exposure; clearance prior to MRI EXAM: ORBITS FOR FOREIGN BODY - 2 VIEW COMPARISON:  06/19/2015 head CT . FINDINGS: There is no evidence of metallic foreign body within the orbits. No significant bone abnormality identified. IMPRESSION: No evidence of metallic foreign body within the orbits. Electronically Signed   By: Delbert Phenix M.D.   On: 06/20/2015 10:27   Ct Head Wo Contrast  06/19/2015  CLINICAL DATA:  Syncopal episode resulting in a fall.  Dementia. EXAM: CT HEAD WITHOUT CONTRAST TECHNIQUE: Contiguous axial images were obtained from the base of the skull through the vertex without intravenous contrast. COMPARISON:  Previous examinations, the most recent dated 01/10/2014. FINDINGS: Diffusely enlarged ventricles and subarachnoid spaces. Patchy white matter low density in both cerebral hemispheres. Old left corona radiata and basal ganglia lacunar infarct. No skull fracture, intracranial hemorrhage, mass  lesion, CT evidence of acute infarction or paranasal sinus air-fluid levels. There are chronic degenerative changes and fragmentation of the anterior arch of C1, unchanged compared to multiple previous examinations. IMPRESSION: 1. No acute abnormality. 2. Mildly progressive atrophy and chronic small vessel white matter ischemic changes. 3.  Stable old left corona radiata and basal ganglia lacunar infarct. Electronically Signed   By: Beckie Salts M.D.   On: 06/19/2015 15:09   Mr Brain Wo Contrast  06/20/2015  CLINICAL DATA:  Near syncope.  Recent falls. EXAM: MRI HEAD WITHOUT CONTRAST TECHNIQUE: Multiplanar, multiecho pulse sequences of the brain and surrounding structures were obtained without intravenous contrast. COMPARISON:  CT 06/19/2015 FINDINGS: Moderate to advanced atrophy. Mild ventricular enlargement consistent with atrophy. Negative for hydrocephalus. Negative for acute infarct. Chronic microvascular ischemic changes in the white matter. Brainstem and cerebellum intact. Negative for intracranial hemorrhage. Negative for mass or edema. No shift of the midline structures. Circle Anne Hahn is patent. Normal skull base. Paranasal sinuses clear. Pituitary not enlarged Advanced cervical degenerative change diffusely. IMPRESSION: No acute infarct Atrophy and chronic ischemic change. Electronically Signed   By: Marlan Palau M.D.   On: 06/20/2015 11:11   US Carotid Bilateral  06/19/2015  CLINICAL DATA:  Syncope, stroke, diabetes EXAM: BILATERAL CAROTID DUPLEX ULTRASOUND TECHNIQUE: Wallace Cullens scale imaging, color Doppler and duplex ultrasound were performed of bilateral carotid and vertebral arteries in the neck. COMPARISON:  02/18/2013 FINDINGS: Criteria: Quantification of carotid stenosis is based on velocity parameters that correlate the residual internal carotid diameter with NASCET-based stenosis levels, using the diameter of the distal internal carotid lumen as the denominator for stenosis measurement. The following velocity measurements were obtained: RIGHT ICA:  107/21 cm/sec CCA:  79/11 cm/sec SYSTOLIC ICA/CCA RATIO:  1.4 DIASTOLIC ICA/CCA RATIO:  1.9 ECA:  79 cm/sec LEFT ICA:  92/28 cm/sec CCA:  73/15 cm/sec SYSTOLIC ICA/CCA RATIO:  1.3 DIASTOLIC ICA/CCA RATIO:  1.8 ECA:  66 cm/sec RIGHT CAROTID ARTERY: Minor echogenic shadowing plaque  formation. No hemodynamically significant right ICA stenosis, velocity elevation, or turbulent flow. Degree of narrowing less than 50%. RIGHT VERTEBRAL ARTERY:  Antegrade LEFT CAROTID ARTERY: Similar scattered minor echogenic plaque formation. No hemodynamically significant left ICA stenosis, velocity elevation, or turbulent flow. LEFT VERTEBRAL ARTERY:  Antegrade IMPRESSION: Mild carotid atherosclerosis. No hemodynamically significant ICA stenosis by ultrasound. Degree of narrowing less than 50% bilaterally. Patent antegrade vertebral flow bilaterally. Electronically Signed   By: Judie Petit.  Shick M.D.   On: 06/19/2015 10:03    Medications: I have reviewed the patient's current medications.   Assessment/Plan: No further events.  MRI of the brain personally reviewed and shows no acute changes. No evidence of stroke or syncope.  Fall suspected.    Recommendations: 1.  EEG pending.  May be performed as an outpatient.        Thana Farr, MD Neurology [phone] 06/20/2015  2:33 PM

## 2015-06-20 NOTE — Care Management Note (Signed)
Case Management Note  Patient Details  Name: Colvin Blatt MRN: [id number] Date of Birth: [birth year]  Subjective/Objective:     [facility]-PT recommended no PT follow-up. No Home Health orders. Discharged to home with wife.              Action/Plan:   Expected Discharge Date:                  Expected Discharge Plan:     In-House Referral:     Discharge planning Services     Post Acute Care Choice:    Choice offered to:     DME Arranged:    DME Agency:     HH Arranged:    HH Agency:     Status of Service:     Medicare Important Message Given:    Date Medicare IM Given:    Medicare IM give by:    Date Additional Medicare IM Given:    Additional Medicare Important Message give by:     If discussed at Long Length of Stay Meetings, dates discussed:    Additional Comments:  Markiesha Delia A, RN 06/20/2015, 1:32 PM

## 2015-06-20 NOTE — Progress Notes (Signed)
Discharge: Pt d/c from room via wheelchair, Family/wife member with the pt. Discharge instructions given to the patient and family members.  No questions from pt, reintegrated to the pt to call or go to the ED for chest discomfort. Pt dressed in street clothes and left with discharge papers and prescriptions in hand. IV d/ced, tele removed and no complaints of pain or discomfort.

## 2015-07-03 ENCOUNTER — Encounter: Payer: Self-pay | Admitting: Emergency Medicine

## 2015-07-03 ENCOUNTER — Emergency Department
Admission: EM | Admit: 2015-07-03 | Discharge: 2015-07-03 | Disposition: A | Discharge status: Home / Self Care | Payer: Medicare Other | Attending: Emergency Medicine | Admitting: Emergency Medicine

## 2015-07-03 DIAGNOSIS — Z87891 Personal history of nicotine dependence: Secondary | ICD-10-CM | POA: Diagnosis not present

## 2015-07-03 DIAGNOSIS — Z7984 Long term (current) use of oral hypoglycemic drugs: Secondary | ICD-10-CM | POA: Insufficient documentation

## 2015-07-03 DIAGNOSIS — I10 Essential (primary) hypertension: Secondary | ICD-10-CM | POA: Insufficient documentation

## 2015-07-03 DIAGNOSIS — R42 Dizziness and giddiness: Secondary | ICD-10-CM

## 2015-07-03 DIAGNOSIS — E119 Type 2 diabetes mellitus without complications: Secondary | ICD-10-CM | POA: Insufficient documentation

## 2015-07-03 DIAGNOSIS — Z79899 Other long term (current) drug therapy: Secondary | ICD-10-CM | POA: Insufficient documentation

## 2015-07-03 DIAGNOSIS — Z7982 Long term (current) use of aspirin: Secondary | ICD-10-CM | POA: Diagnosis not present

## 2015-07-03 DIAGNOSIS — F0390 Unspecified dementia without behavioral disturbance: Secondary | ICD-10-CM | POA: Insufficient documentation

## 2015-07-03 LAB — URINALYSIS COMPLETE WITH MICROSCOPIC (ARMC ONLY)
BILIRUBIN URINE: NEGATIVE
Bacteria, UA: NONE SEEN
HGB URINE DIPSTICK: NEGATIVE
Ketones, ur: NEGATIVE mg/dL
LEUKOCYTES UA: NEGATIVE
NITRITE: NEGATIVE
Protein, ur: NEGATIVE mg/dL
SPECIFIC GRAVITY, URINE: 1.003 — AB (ref 1.005–1.030)
Squamous Epithelial / LPF: NONE SEEN
pH: 6 (ref 5.0–8.0)

## 2015-07-03 LAB — GLUCOSE, CAPILLARY: Glucose-Capillary: 97 mg/dL (ref 65–99)

## 2015-07-03 LAB — CBC
HCT: 37.8 % — ABNORMAL LOW (ref 40.0–52.0)
Hemoglobin: 12.6 g/dL — ABNORMAL LOW (ref 13.0–18.0)
MCH: 28.7 pg (ref 26.0–34.0)
MCHC: 33.3 g/dL (ref 32.0–36.0)
MCV: 86.1 fL (ref 80.0–100.0)
PLATELETS: 492 10*3/uL — AB (ref 150–440)
RBC: 4.39 MIL/uL — ABNORMAL LOW (ref 4.40–5.90)
RDW: 13.5 % (ref 11.5–14.5)
WBC: 4.4 10*3/uL (ref 3.8–10.6)

## 2015-07-03 LAB — BASIC METABOLIC PANEL
Anion gap: 6 (ref 5–15)
BUN: 13 mg/dL (ref 6–20)
CO2: 29 mmol/L (ref 22–32)
CREATININE: 1.02 mg/dL (ref 0.61–1.24)
Calcium: 9.1 mg/dL (ref 8.9–10.3)
Chloride: 105 mmol/L (ref 101–111)
GFR calc Af Amer: >60 mL/min (ref >60)
Glucose, Bld: 109 mg/dL — ABNORMAL HIGH (ref 65–99)
Potassium: 3.8 mmol/L (ref 3.5–5.1)
SODIUM: 140 mmol/L (ref 135–145)

## 2015-07-03 LAB — TROPONIN I: Troponin I: <0.03 ng/mL (ref <0.031)

## 2015-07-03 MED ORDER — MECLIZINE HCL 25 MG PO TABS
25.0000 mg | ORAL_TABLET | Freq: Once | ORAL | Status: AC
  Administered 2015-07-03: 25 mg via ORAL
  Filled 2015-07-03: qty 1

## 2015-07-03 MED ORDER — MECLIZINE HCL 25 MG PO TABS: 25.0000 mg | ORAL_TABLET | Freq: Three times a day (TID) | ORAL | Status: DC | PRN

## 2015-07-03 NOTE — ED Notes (Addendum)
Patient states he was discharged from hospital last week. Was put on new medication (states he thinks his diabetes medication was changed). States since then has felt "off balance". Denies any chest pain or shortness of breath. Reports very mild headache.

## 2015-07-03 NOTE — ED Provider Notes (Signed)
[facility] Department Provider Note    ____________________________________________  Time seen: ~1740  I have reviewed the triage vital signs and the nursing notes.   HISTORY  Chief Complaint Dizziness   History limited by: Not Limited   HPI Ruben Ruiz is a 82 y.o. male who presents to the emergency department today because of concerns for dizziness. He states he gets the feeling that the room is spinning. He states it is worse when he stands up. It will go away after a couple of minutes and with walking. Patient states he also has, sensation when he moves his head quickly. He was recently admitted to the hospitalfor a syncopal episode and similar symptoms. During that admission he underwent both a CT and MRI of the brain. No acute findings. The patient denies any chest pain or palpitations. Denies any fevers.    Past Medical History  Diagnosis Date  . Dementia   . Hypertension   . Diabetes mellitus without complication (HCC)   . GERD (gastroesophageal reflux disease)     Patient Active Problem List   Diagnosis Date Noted  . Hypertensive urgency 06/20/2015  . Hypoglycemia associated with diabetes (HCC) 06/20/2015  . Anemia 06/20/2015  . Syncope 06/18/2015    Past Surgical History  Procedure Laterality Date  . Eye surgery      Current Outpatient Rx  Name  Route  Sig  Dispense  Refill  . aspirin EC 81 MG tablet   Oral   Take 81 mg by mouth daily.         Marland Kitchen glipiZIDE (GLIPIZIDE XL) 5 MG 24 hr tablet   Oral   Take 1 tablet (5 mg total) by mouth daily with breakfast.   30 tablet   6   . losartan (COZAAR) 100 MG tablet   Oral   Take 100 mg by mouth at bedtime.          . metFORMIN (GLUCOPHAGE) 500 MG tablet   Oral   Take 1,000 mg by mouth daily with breakfast.         . metoprolol succinate (TOPROL-XL) 50 MG 24 hr tablet   Oral   Take 50 mg by mouth at bedtime. Take with or immediately following a meal.         . omeprazole (PRILOSEC) 20 MG capsule   Oral   Take 40 mg by mouth at bedtime.          . pravastatin (PRAVACHOL) 20 MG tablet   Oral   Take 20 mg by mouth at bedtime.            Allergies Review of patient's allergies indicates no known allergies.  No family history on file.  Social History Social History  Substance Use Topics  . Smoking status: Former Games developer  . Smokeless tobacco: None  . Alcohol Use: No    Review of Systems  Constitutional: Negative for fever. Cardiovascular: Negative for chest pain. Respiratory: Negative for shortness of breath. Gastrointestinal: Negative for abdominal pain, vomiting and diarrhea. Neurological: Negative for headaches, focal weakness or numbness.  10-point ROS otherwise negative.  ____________________________________________   PHYSICAL EXAM:  VITAL SIGNS: ED Triage Vitals  Enc Vitals Group     BP 07/03/15 1712 170/96 mmHg     Pulse Rate 07/03/15 1712 65     Resp 07/03/15 1712 18     Temp 07/03/15 1712 97.7 F (36.5 C)     Temp Source 07/03/15 1712 Oral  SpO2 07/03/15 1712 97 %     Weight 07/03/15 1712 140 lb (63.504 kg)     Height 07/03/15 1712 5\' 9"  (1.753 m)     Head Cir --      Peak Flow --      Pain Score 07/03/15 1715 2   Constitutional: Alert and oriented. Well appearing and in no distress. Eyes: Conjunctivae are normal. PERRL. Normal extraocular movements. ENT   Head: Normocephalic and atraumatic.   Nose: No congestion/rhinnorhea.   Mouth/Throat: Mucous membranes are moist.   Neck: No stridor. Hematological/Lymphatic/Immunilogical: No cervical lymphadenopathy. Cardiovascular: Normal rate, regular rhythm.  No murmurs, rubs, or gallops. Respiratory: Normal respiratory effort without tachypnea nor retractions. Breath sounds are clear and equal bilaterally. No wheezes/rales/rhonchi. Gastrointestinal: Soft and nontender. No distention. Genitourinary: Deferred Musculoskeletal: Normal range of  motion in all extremities. No joint effusions.  No lower extremity tenderness nor edema. Neurologic:  Normal speech and language. No gross focal neurologic deficits are appreciated.  Skin:  Skin is warm, dry and intact. No rash noted. Psychiatric: Mood and affect are normal. Speech and behavior are normal. Patient exhibits appropriate insight and judgment.  ____________________________________________    LABS (pertinent positives/negatives)  Labs Reviewed  BASIC METABOLIC PANEL - Abnormal; Notable for the following:    Glucose, Bld 109 (*)    All other components within normal limits  CBC - Abnormal; Notable for the following:    RBC 4.39 (*)    Hemoglobin 12.6 (*)    HCT 37.8 (*)    Platelets 492 (*)    All other components within normal limits  URINALYSIS COMPLETEWITH MICROSCOPIC ([facility] ONLY) - Abnormal; Notable for the following:    Color, Urine STRAW (*)    APPearance CLEAR (*)    Glucose, UA >500 (*)    Specific Gravity, Urine 1.003 (*)    All other components within normal limits  GLUCOSE, CAPILLARY  TROPONIN I  CBG MONITORING, ED     ____________________________________________   EKG  I, Phineas SemenGraydon Terrah Decoster, attending physician, personally viewed and interpreted this EKG  EKG Time: 1719 Rate: 64 Rhythm: normal sinus rhythm Axis: normal Intervals: qtc 418 QRS: narrow ST changes: no st elevation Impression: normal ekg   ____________________________________________    RADIOLOGY  None   ____________________________________________   PROCEDURES  Procedure(s) performed: None  Critical Care performed: No  ____________________________________________   INITIAL IMPRESSION / ASSESSMENT AND PLAN / ED COURSE  Pertinent labs & imaging results that were available during my care of the patient were reviewed by me and considered in my medical decision making (see chart for details).  Patient presented to the emergency department today because of continued  dizziness. Patient did have a hospitalization last month for a syncopal episode and similar dizziness symptoms. During that admission patient had an MRI and neuro consult. MRI was negative. At this point patient's clinical story sounds somewhat like BPPV. I do not think repeat neuro imaging is warranted at this time. Additionally blood work today without concerning findings. Will trial patient on Antivert. Encourage primary care follow-up.  ____________________________________________   FINAL CLINICAL IMPRESSION(S) / ED DIAGNOSES  Final diagnoses:  Dizziness     Phineas SemenGraydon Levy Wellman, MD 07/04/15 1104

## 2015-07-03 NOTE — Discharge Instructions (Signed)
Please seek medical attention for any high fevers, chest pain, shortness of breath, change in behavior, persistent vomiting, bloody stool or any other new or concerning symptoms. ° ° °Dizziness °Dizziness is a common problem. It is a feeling of unsteadiness or light-headedness. You may feel like you are about to faint. Dizziness can lead to injury if you stumble or fall. Anyone can become dizzy, but dizziness is more common in older adults. This condition can be caused by a number of things, including medicines, dehydration, or illness. °HOME CARE INSTRUCTIONS °Taking these steps may help with your condition: °Eating and Drinking °· Drink enough fluid to keep your urine clear or pale yellow. This helps to keep you from becoming dehydrated. Try to drink more clear fluids, such as water. °· Do not drink alcohol. °· Limit your caffeine intake if directed by your health care provider. °· Limit your salt intake if directed by your health care provider. °Activity °· Avoid making quick movements. °¨ Rise slowly from chairs and steady yourself until you feel okay. °¨ In the morning, first sit up on the side of the bed. When you feel okay, stand slowly while you hold onto something until you know that your balance is fine. °· Move your legs often if you need to stand in one place for a long time. Tighten and relax your muscles in your legs while you are standing. °· Do not drive or operate heavy machinery if you feel dizzy. °· Avoid bending down if you feel dizzy. Place items in your home so that they are easy for you to reach without leaning over. °Lifestyle °· Do not use any tobacco products, including cigarettes, chewing tobacco, or electronic cigarettes. If you need help quitting, ask your health care provider. °· Try to reduce your stress level, such as with yoga or meditation. Talk with your health care provider if you need help. °General Instructions °· Watch your dizziness for any changes. °· Take medicines only as  directed by your health care provider. Talk with your health care provider if you think that your dizziness is caused by a medicine that you are taking. °· Tell a friend or a family member that you are feeling dizzy. If he or she notices any changes in your behavior, have this person call your health care provider. °· Keep all follow-up visits as directed by your health care provider. This is important. °SEEK MEDICAL CARE IF: °· Your dizziness does not go away. °· Your dizziness or light-headedness gets worse. °· You feel nauseous. °· You have reduced hearing. °· You have new symptoms. °· You are unsteady on your feet or you feel like the room is spinning. °SEEK IMMEDIATE MEDICAL CARE IF: °· You vomit or have diarrhea and are unable to eat or drink anything. °· You have problems talking, walking, swallowing, or using your arms, hands, or legs. °· You feel generally weak. °· You are not thinking clearly or you have trouble forming sentences. It may take a friend or family member to notice this. °· You have chest pain, abdominal pain, shortness of breath, or sweating. °· Your vision changes. °· You notice any bleeding. °· You have a headache. °· You have neck pain or a stiff neck. °· You have a fever. °  °This information is not intended to replace advice given to you by your health care provider. Make sure you discuss any questions you have with your health care provider. °  °Document Released: 10/11/2000 Document Revised: 09/01/2014   Document Reviewed: 04/13/2014 °Elsevier Interactive Patient Education ©2016 [employer] ° °

## 2017-01-01 ENCOUNTER — Emergency Department: Payer: Medicare Other

## 2017-01-01 ENCOUNTER — Encounter: Payer: Self-pay | Admitting: Intensive Care

## 2017-01-01 ENCOUNTER — Emergency Department
Admission: EM | Admit: 2017-01-01 | Discharge: 2017-01-01 | Disposition: A | Discharge status: Home / Self Care | Payer: Medicare Other | Attending: Emergency Medicine | Admitting: Emergency Medicine

## 2017-01-01 DIAGNOSIS — Z7982 Long term (current) use of aspirin: Secondary | ICD-10-CM | POA: Diagnosis not present

## 2017-01-01 DIAGNOSIS — I10 Essential (primary) hypertension: Secondary | ICD-10-CM | POA: Diagnosis not present

## 2017-01-01 DIAGNOSIS — E860 Dehydration: Secondary | ICD-10-CM | POA: Diagnosis not present

## 2017-01-01 DIAGNOSIS — F0390 Unspecified dementia without behavioral disturbance: Secondary | ICD-10-CM | POA: Insufficient documentation

## 2017-01-01 DIAGNOSIS — E1165 Type 2 diabetes mellitus with hyperglycemia: Secondary | ICD-10-CM | POA: Diagnosis not present

## 2017-01-01 DIAGNOSIS — R739 Hyperglycemia, unspecified: Secondary | ICD-10-CM

## 2017-01-01 DIAGNOSIS — Z7984 Long term (current) use of oral hypoglycemic drugs: Secondary | ICD-10-CM | POA: Diagnosis not present

## 2017-01-01 DIAGNOSIS — Z79899 Other long term (current) drug therapy: Secondary | ICD-10-CM | POA: Diagnosis not present

## 2017-01-01 LAB — CBC
HEMATOCRIT: 38.9 % — AB (ref 40.0–52.0)
HEMOGLOBIN: 13 g/dL (ref 13.0–18.0)
MCH: 28.2 pg (ref 26.0–34.0)
MCHC: 33.3 g/dL (ref 32.0–36.0)
MCV: 84.4 fL (ref 80.0–100.0)
Platelets: 699 10*3/uL — ABNORMAL HIGH (ref 150–440)
RBC: 4.61 MIL/uL (ref 4.40–5.90)
RDW: 13.8 % (ref 11.5–14.5)
WBC: 4.7 10*3/uL (ref 3.8–10.6)

## 2017-01-01 LAB — URINALYSIS, COMPLETE (UACMP) WITH MICROSCOPIC
BACTERIA UA: NONE SEEN
BILIRUBIN URINE: NEGATIVE
Glucose, UA: >=500 mg/dL — AB
HGB URINE DIPSTICK: NEGATIVE
KETONES UR: NEGATIVE mg/dL
LEUKOCYTES UA: NEGATIVE
NITRITE: NEGATIVE
PROTEIN: NEGATIVE mg/dL
RBC / HPF: NONE SEEN RBC/hpf (ref 0–5)
Specific Gravity, Urine: 1.021 (ref 1.005–1.030)
Squamous Epithelial / LPF: NONE SEEN
WBC, UA: NONE SEEN WBC/hpf (ref 0–5)
pH: 5 (ref 5.0–8.0)

## 2017-01-01 LAB — GLUCOSE, CAPILLARY
GLUCOSE-CAPILLARY: 317 mg/dL — AB (ref 65–99)
GLUCOSE-CAPILLARY: 206 mg/dL — AB (ref 65–99)
Glucose-Capillary: 97 mg/dL (ref 65–99)

## 2017-01-01 LAB — BASIC METABOLIC PANEL
ANION GAP: 6 (ref 5–15)
BUN: 12 mg/dL (ref 6–20)
CALCIUM: 9 mg/dL (ref 8.9–10.3)
CO2: 25 mmol/L (ref 22–32)
Chloride: 102 mmol/L (ref 101–111)
Creatinine, Ser: 1.07 mg/dL (ref 0.61–1.24)
GFR, EST NON AFRICAN AMERICAN: 56 mL/min — AB (ref >60)
GLUCOSE: 376 mg/dL — AB (ref 65–99)
POTASSIUM: 3.5 mmol/L (ref 3.5–5.1)
Sodium: 133 mmol/L — ABNORMAL LOW (ref 135–145)

## 2017-01-01 LAB — TROPONIN I: Troponin I: <0.03 ng/mL (ref <0.03)

## 2017-01-01 MED ORDER — INSULIN ASPART 100 UNIT/ML ~~LOC~~ SOLN
3.0000 [IU] | Freq: Once | SUBCUTANEOUS | Status: AC
  Administered 2017-01-01: 3 [IU] via SUBCUTANEOUS
  Filled 2017-01-01: qty 1

## 2017-01-01 MED ORDER — SODIUM CHLORIDE 0.9 % IV BOLUS (SEPSIS)
500.0000 mL | Freq: Once | INTRAVENOUS | Status: AC
  Administered 2017-01-01: 500 mL via INTRAVENOUS

## 2017-01-01 NOTE — ED Notes (Signed)
BS 97.

## 2017-01-01 NOTE — ED Triage Notes (Signed)
Patient is here for hyperglycemia. Reports his aid at home took his blood sugar and it was over 500. CBG in triage 317. A&O x4 Denies pain or symptoms at this time

## 2017-01-01 NOTE — ED Notes (Signed)
Patient transported to CT 

## 2017-01-01 NOTE — ED Notes (Signed)
Pt returned from CT via stretcher.

## 2017-01-01 NOTE — ED Notes (Signed)
Pt ambulatory to toilet with 1 assist.  

## 2017-01-01 NOTE — ED Provider Notes (Signed)
[facility] Emergency Department Provider Note  ____________________________________________   First MD Initiated Contact with Patient 01/01/17 1359     (approximate)  I have reviewed the triage vital signs and the nursing notes.   HISTORY  Chief Complaint Hyperglycemia    HPI Elton SinJames Buttrey is a 83 y.o. male presents for evaluation of elevated blood sugar and feeling lightheaded when standing.  patient reports that for a few days he's been feeling lightheaded when he stands up.  Denies any chest pain. Reports his blood sugar has been elevated. He's been taking his medications including his oral diabetes medicine as usual. He is beginning to feel lightheaded, reports a feeling of feeling a little bit dizzy when he stands up for the last few days. He is noticed that he has been urinating a little more than usual. No chest pain. No fevers or chills. No nausea or vomiting. Denies any numbness or weakness in arm or leg. No trouble speaking or weakness in the face   Past Medical History:  Diagnosis Date  . Dementia   . Diabetes mellitus without complication (HCC)   . GERD (gastroesophageal reflux disease)   . Hypertension     Patient Active Problem List   Diagnosis Date Noted  . Hypertensive urgency 06/20/2015  . Hypoglycemia associated with diabetes (HCC) 06/20/2015  . Anemia 06/20/2015  . Syncope 06/18/2015    Past Surgical History:  Procedure Laterality Date  . EYE SURGERY      Prior to Admission medications   Medication Sig Start Date End Date Taking? Authorizing Provider  amLODipine (NORVASC) 5 MG tablet Take 5 mg by mouth daily. 09/29/16  Yes [provider]  aspirin EC 81 MG tablet Take 81 mg by mouth daily.   Yes [provider]  glipiZIDE (GLIPIZIDE XL) 5 MG 24 hr tablet Take 1 tablet (5 mg total) by mouth daily with breakfast. 06/20/15  Yes Katharina CaperVaickute, Rima, MD  losartan (COZAAR) 100 MG tablet Take 100 mg by mouth at  bedtime.    Yes [provider]  metFORMIN (GLUCOPHAGE) 1000 MG tablet Take 1,000 mg by mouth daily. 09/29/16  Yes [provider]  omeprazole (PRILOSEC) 20 MG capsule Take 40 mg by mouth at bedtime.    Yes [provider]  pravastatin (PRAVACHOL) 20 MG tablet Take 20 mg by mouth at bedtime.    Yes [provider]  meclizine (ANTIVERT) 25 MG tablet Take 1 tablet (25 mg total) by mouth 3 (three) times daily as needed for dizziness. Patient not taking: Reported on 01/01/2017 07/03/15   Phineas SemenGoodman, Graydon, MD    Allergies Patient has no known allergies.  History reviewed. No pertinent family history.  Social History Social History  Substance Use Topics  . Smoking status: Former Games developermoker  . Smokeless tobacco: Never Used  . Alcohol use No    Review of Systems Constitutional: No fever/chills Eyes: No visual changes. ENT: No sore throat. Cardiovascular: Denies chest pain. Respiratory: Denies shortness of breath. Gastrointestinal: No abdominal pain.  No nausea, no vomiting.  No diarrhea.  No constipation. Genitourinary: Negative for dysuria. Musculoskeletal: Negative for back pain. Skin: Negative for rash. Neurological: Negative for headaches, focal weakness or numbness.    ____________________________________________   PHYSICAL EXAM:  VITAL SIGNS: ED Triage Vitals [01/01/17 1255]  Enc Vitals Group     BP (!) 154/81     Pulse Rate 77     Resp 16     Temp 98.5 F (36.9 C)  Temp Source Oral     SpO2 99 %     Weight 130 lb (59 kg)     Height 5\' 9"  (1.753 m)     Head Circumference      Peak Flow      Pain Score      Pain Loc      Pain Edu?      Excl. in GC?     Constitutional: Alert and oriented. Well appearing and in no acute distress.very pleasant. Eyes: Conjunctivae are normal. Head: Atraumatic. Nose: No congestion/rhinnorhea. Mouth/Throat: Mucous membranes are moist. Neck: No stridor.   Cardiovascular: Normal rate, regular rhythm.  Grossly normal heart sounds.  Good peripheral circulation. Respiratory: Normal respiratory effort.  No retractions. Lungs CTAB. Gastrointestinal: Soft and nontender. No distention. Musculoskeletal: No lower extremity tenderness nor edema. Neurologic:  Normal speech and language. No gross focal neurologic deficits are appreciated.  Skin:  Skin is warm, dry and intact. No rash noted. Psychiatric: Mood and affect are normal. Speech and behavior are normal.  ____________________________________________   LABS (all labs ordered are listed, but only abnormal results are displayed)  Labs Reviewed  BASIC METABOLIC PANEL - Abnormal; Notable for the following:       Result Value   Sodium 133 (*)    Glucose, Bld 376 (*)    GFR calc non Af Amer 56 (*)    All other components within normal limits  CBC - Abnormal; Notable for the following:    HCT 38.9 (*)    Platelets 699 (*)    All other components within normal limits  URINALYSIS, COMPLETE (UACMP) WITH MICROSCOPIC - Abnormal; Notable for the following:    Color, Urine YELLOW (*)    APPearance CLEAR (*)    Glucose, UA >=500 (*)    All other components within normal limits  GLUCOSE, CAPILLARY - Abnormal; Notable for the following:    Glucose-Capillary 317 (*)    All other components within normal limits  GLUCOSE, CAPILLARY - Abnormal; Notable for the following:    Glucose-Capillary 206 (*)    All other components within normal limits  TROPONIN I  GLUCOSE, CAPILLARY  CBG MONITORING, ED  CBG MONITORING, ED   ____________________________________________  EKG  reviewed and interpreted by me and I 1430 Heart rate 70 QRS 90 QTc 440 Normal sinus rhythm, single PVC, no evidence of acute ischemia or elevation noted. ____________________________________________  RADIOLOGY  Ct Head Wo Contrast  Result Date: 01/01/2017 CLINICAL DATA:  83 year old male with dizziness. EXAM: CT HEAD WITHOUT CONTRAST TECHNIQUE: Contiguous axial images were  obtained from the base of the skull through the vertex without intravenous contrast. COMPARISON:  06/19/2015 CT and prior studies FINDINGS: Brain: No evidence of acute infarction, hemorrhage, hydrocephalus, extra-axial collection or mass lesion/mass effect. Atrophy, chronic small-vessel white matter ischemic changes and remote left corona radiata/ basal ganglia infarct again noted. Vascular: Mild intracranial atherosclerotic vascular calcifications noted. Skull: Normal. Negative for fracture or focal lesion. Sinuses/Orbits: No acute finding. Other: None IMPRESSION: 1. No evidence of acute intracranial abnormality 2. Atrophy, chronic small-vessel white matter ischemic changes and remote left corona radiata/basal ganglia infarct. Electronically Signed   By: Harmon Pier M.D.   On: 01/01/2017 14:48    ____________________________________________   PROCEDURES  Procedure(s) performed: None  Procedures  Critical Care performed: No  ____________________________________________   INITIAL IMPRESSION / ASSESSMENT AND PLAN / ED COURSE  Pertinent labs & imaging results that were available during my care of the patient were reviewed by  me and considered in my medical decision making (see chart for details).  concerns for evaluation of lightheadedness. Reports elevated sugar without associated systemic or infectious symptoms. He reports compliance with his medication, but reports whenever he stands up he feels slightly weak and lightheaded. No focal neurologic deficits. Suspect likely some mild dehydration due to hyperglycemia which may be causing his lightheadedness. He's had syncope workup in the past, but denies any syncope today. He is alert and well oriented. Has a history of noted dementia, but at present seems very well oriented.  We will hydrate, provide small dose of insulin and attempt to lower his glucose. No evidence to support DKA. No confusion or mental status changes noted. No cardiac or  pulmonary symptoms. Reassuring exam    ----------------------------------------- 5:58 PM on 01/01/2017 -----------------------------------------  Patient resting comfortably. Drinking juice, eating a sandwich. His orthostatics were reviewed and he is no longer any lightheadedness with standing. He appears stable, improved. Likely some mild dehydration with associated hyperglycemia. Patient in no distress, appears appropriate for discharge at this time.  Return precautions and treatment recommendations and follow-up discussed with the patient who is agreeable with the plan.  ____________________________________________   FINAL CLINICAL IMPRESSION(S) / ED DIAGNOSES  Final diagnoses:  Hyperglycemia  Dehydration      NEW MEDICATIONS STARTED DURING THIS VISIT:  New Prescriptions   No medications on file     Note:  This document was prepared using Dragon voice recognition software and may include unintentional dictation errors.     Sharyn Creamer, MD 01/01/17 1758

## 2017-02-09 IMAGING — MR MR HEAD W/O CM
10 series · 48 of 48 positions shown · non-contrast
Comparison: CT 06/19/2015

CLINICAL DATA: Near syncope.  Recent falls.

EXAM:
MRI HEAD WITHOUT CONTRAST
TECHNIQUE: Multiplanar, multiecho pulse sequences of the brain and surrounding
structures were obtained without intravenous contrast.

[Series 2: T1 · sagittal · 5.0mm · 0.45mm/px · 4 of 29 slices shown (1 of 2)]
[im 1/29]
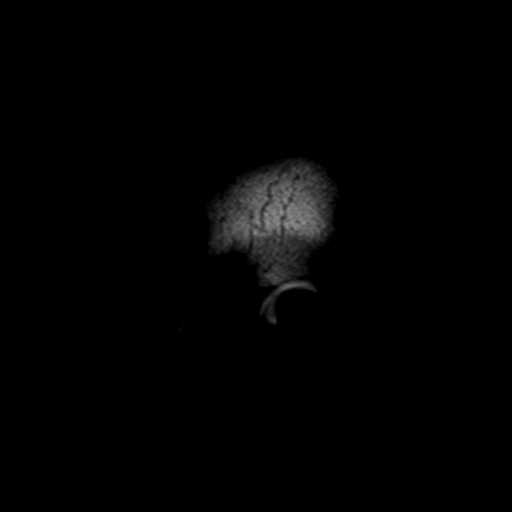
[im 10/29]
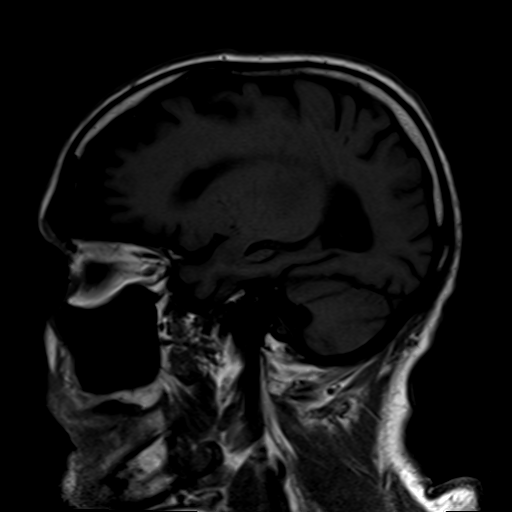
[im 19/29]
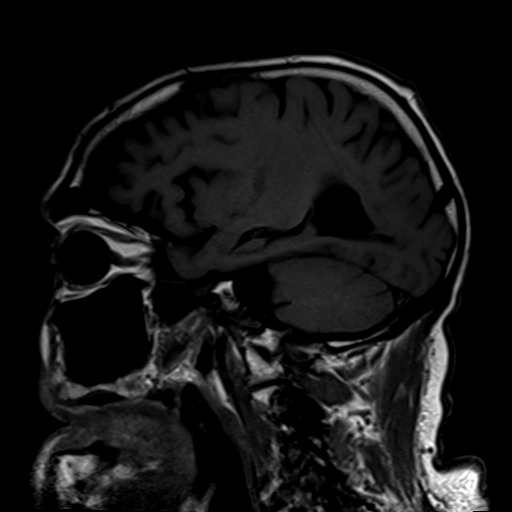
[im 29/29]
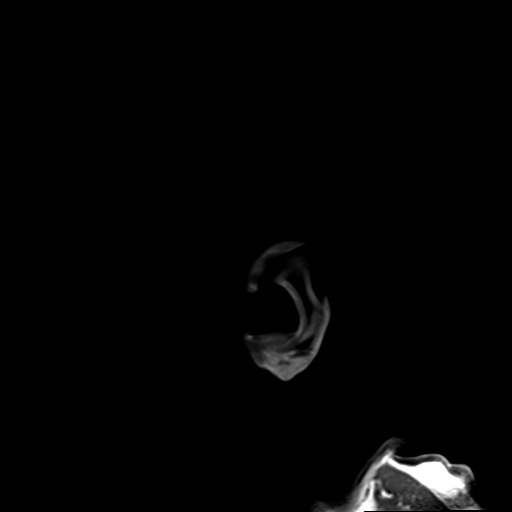

[Series 4: DWI · axial · 3.0mm · 1.80mm/px · z∈[-63,+97]mm · 6 of 54 slices shown (1 of 4)]
[im 1/54]
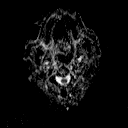
[im 11/54]
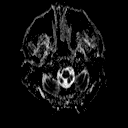
[im 22/54]
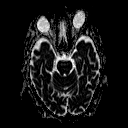
[im 32/54]
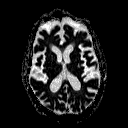
[im 43/54]
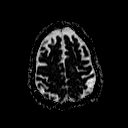
[im 54/54]
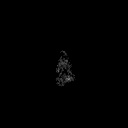

[Series 6: DWI · coronal · 3.0mm · 1.80mm/px · 6 of 51 slices shown (2 of 4)]
[im 1/51]
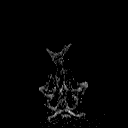
[im 11/51]
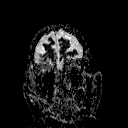
[im 21/51]
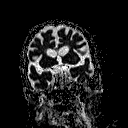
[im 31/51]
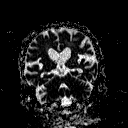
[im 41/51]
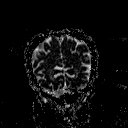
[im 51/51]
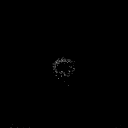

[Series 7: T2 · axial · 5.0mm · 0.60mm/px · z∈[-56,+99]mm · 3 of 25 slices shown (1 of 3)]
[im 1/25]
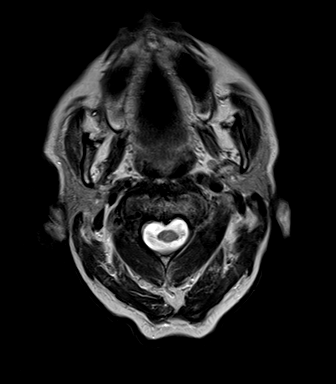
[im 13/25]
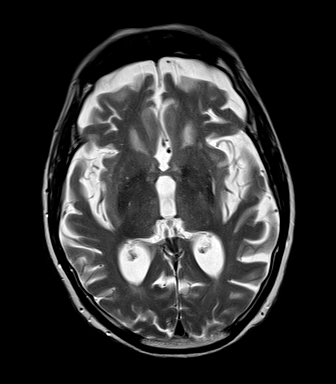
[im 25/25]
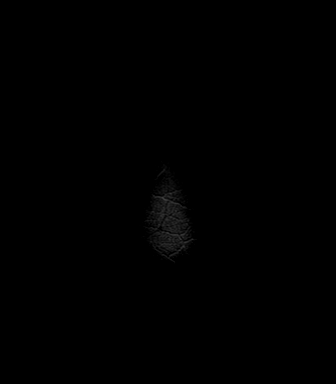

[Series 8: FLAIR · axial · 5.0mm · 0.45mm/px · z∈[-56,+99]mm · 3 of 25 slices shown]
[im 1/25]
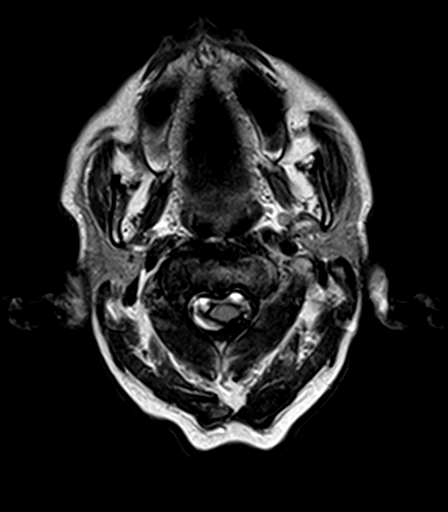
[im 13/25]
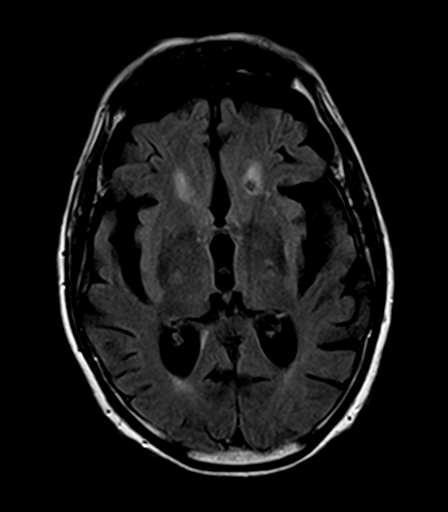
[im 25/25]
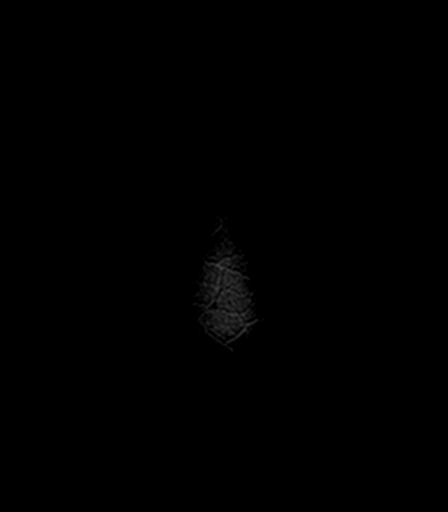

[Series 9: T2 · axial · 5.0mm · 0.45mm/px · z∈[-56,+99]mm · 3 of 25 slices shown (2 of 3)]
[im 1/25]
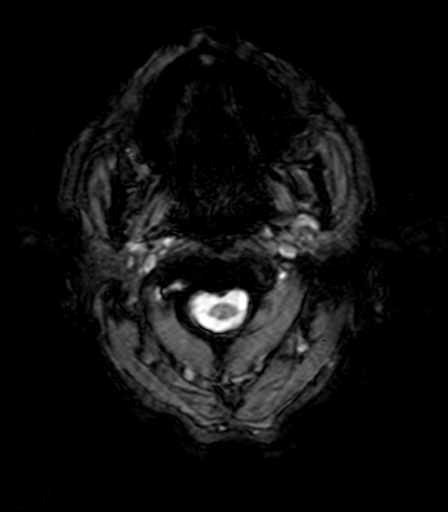
[im 13/25]
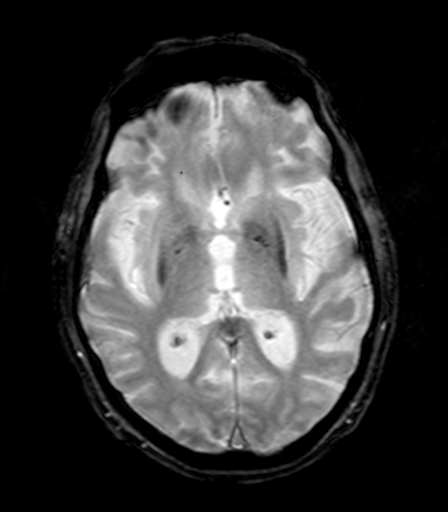
[im 25/25]
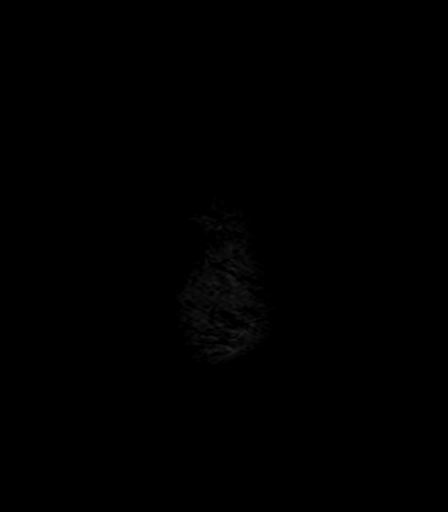

[Series 10: T1 · axial · 3.0mm · 1.00mm/px · z∈[-65,+111]mm · 7 of 60 slices shown (2 of 2)]
[im 1/60]
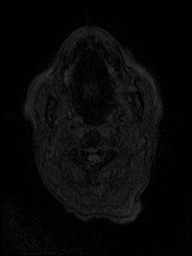
[im 10/60]
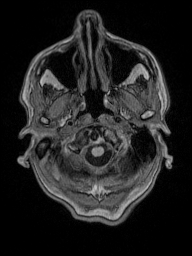
[im 20/60]
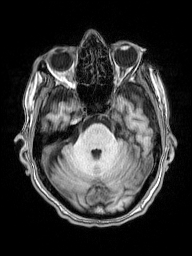
[im 30/60]
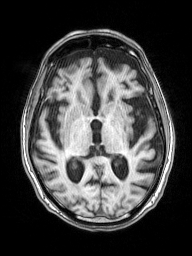
[im 40/60]
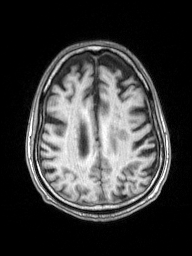
[im 50/60]
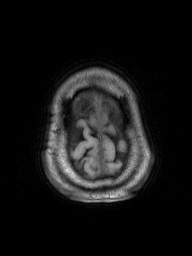
[im 60/60]
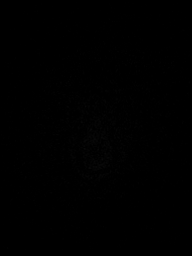

[Series 11: T2 · coronal · 5.0mm · 0.49mm/px · 4 of 32 slices shown (3 of 3)]
[im 1/32]
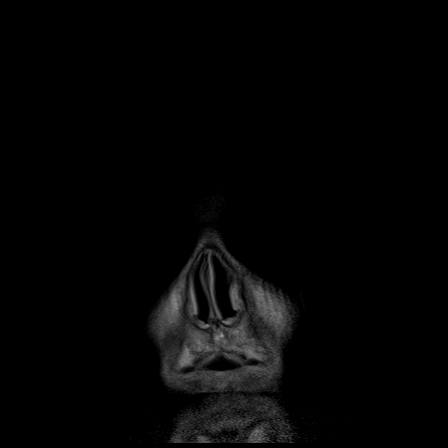
[im 11/32]
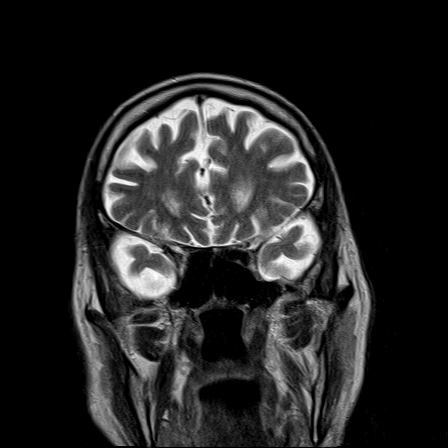
[im 21/32]
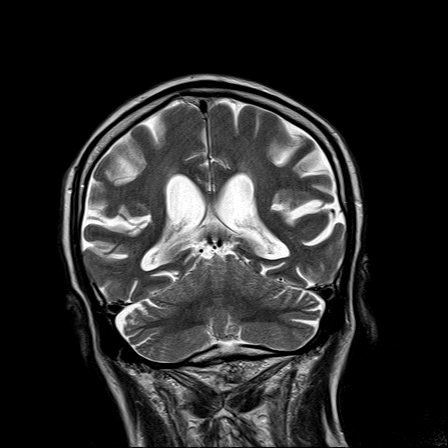
[im 32/32]
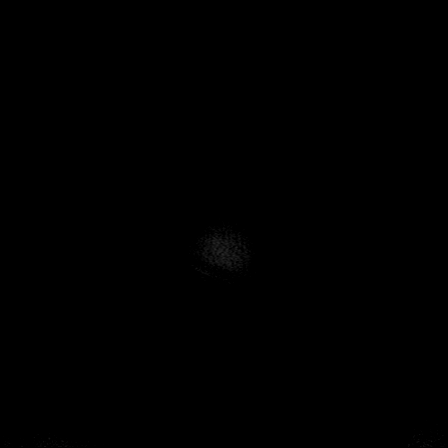

[Series 100: DWI · axial · 3.0mm · 1.80mm/px · z∈[-63,+100]mm · 6 of 51 slices shown (3 of 4)]
[im 1/51]
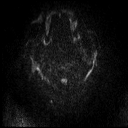
[im 11/51]
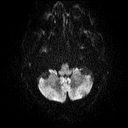
[im 21/51]
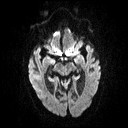
[im 31/51]
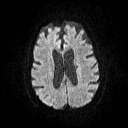
[im 41/51]
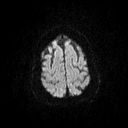
[im 51/51]
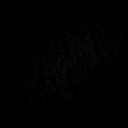

[Series 101: DWI · coronal · 3.0mm · 1.80mm/px · 6 of 51 slices shown (4 of 4)]
[im 1/51]
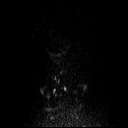
[im 11/51]
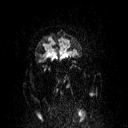
[im 21/51]
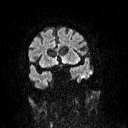
[im 31/51]
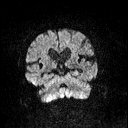
[im 41/51]
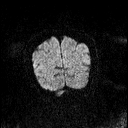
[im 51/51]
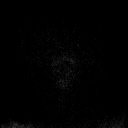

[48 of 48 positions shown; findings below may reference images not displayed]

FINDINGS: Moderate to advanced atrophy. Mild ventricular enlargement
consistent with atrophy. Negative for hydrocephalus.

Negative for acute infarct. Chronic microvascular ischemic changes
in the white matter. Brainstem and cerebellum intact.

Negative for intracranial hemorrhage. Negative for mass or edema. No
shift of the midline structures.

Circle Willis is patent. Normal skull base. Paranasal sinuses clear.
Pituitary not enlarged

Advanced cervical degenerative change diffusely.
IMPRESSION: No acute infarct

Atrophy and chronic ischemic change.

## 2017-06-27 ENCOUNTER — Encounter: Payer: Self-pay | Admitting: *Deleted

## 2017-07-12 ENCOUNTER — Ambulatory Visit
Admission: RE | Admit: 2017-07-12 | Discharge: 2017-07-12 | Disposition: A | Discharge status: Home / Self Care | Payer: Medicare Other | Source: Ambulatory Visit | Attending: Ophthalmology | Admitting: Ophthalmology

## 2017-07-12 ENCOUNTER — Ambulatory Visit: Payer: Medicare Other | Admitting: Registered Nurse

## 2017-07-12 ENCOUNTER — Encounter
Admission: RE | Disposition: A | Discharge status: Home / Self Care | Payer: Self-pay | Source: Ambulatory Visit | Attending: Ophthalmology

## 2017-07-12 DIAGNOSIS — Z538 Procedure and treatment not carried out for other reasons: Secondary | ICD-10-CM | POA: Diagnosis not present

## 2017-07-12 DIAGNOSIS — H2512 Age-related nuclear cataract, left eye: Secondary | ICD-10-CM | POA: Diagnosis not present

## 2017-07-12 HISTORY — DX: Unspecified hearing loss, unspecified ear: H91.90

## 2017-07-12 SURGERY — PHACOEMULSIFICATION, CATARACT, WITH IOL INSERTION
Anesthesia: Topical | Site: Eye | Laterality: Left | Wound class: Clean

## 2017-07-12 MED ORDER — TRYPAN BLUE 0.06 % OP SOLN
OPHTHALMIC | Status: AC
  Filled 2017-07-12: qty 0.5

## 2017-07-12 MED ORDER — HYALURONIDASE HUMAN 150 UNIT/ML IJ SOLN
INTRAMUSCULAR | Status: AC
Start: 2017-07-12 — End: ?
  Filled 2017-07-12: qty 1

## 2017-07-12 MED ORDER — NA HYALUR & NA CHOND-NA HYALUR 0.55-0.5 ML IO KIT
PACK | INTRAOCULAR | Status: AC
  Filled 2017-07-12: qty 1.05

## 2017-07-12 MED ORDER — NEOMYCIN-POLYMYXIN-DEXAMETH 3.5-10000-0.1 OP OINT
TOPICAL_OINTMENT | OPHTHALMIC | Status: AC
  Filled 2017-07-12: qty 3.5

## 2017-07-12 MED ORDER — EPINEPHRINE PF 1 MG/ML IJ SOLN
INTRAMUSCULAR | Status: AC
  Filled 2017-07-12: qty 2

## 2017-07-12 MED ORDER — FENTANYL CITRATE (PF) 100 MCG/2ML IJ SOLN
INTRAMUSCULAR | Status: AC
  Filled 2017-07-12: qty 2

## 2017-07-12 MED ORDER — POVIDONE-IODINE 5 % OP SOLN
OPHTHALMIC | Status: AC
  Filled 2017-07-12: qty 30

## 2017-07-12 MED ORDER — LIDOCAINE HCL (PF) 4 % IJ SOLN
INTRAMUSCULAR | Status: AC
  Filled 2017-07-12: qty 5

## 2017-07-12 SURGICAL SUPPLY — 15 items
GLOVE BIO SURGEON STRL SZ8 (GLOVE) ×3 IMPLANT
GLOVE BIOGEL M 6.5 STRL (GLOVE) ×3 IMPLANT
GLOVE SURG LX 7.5 STRW (GLOVE) ×2
GLOVE SURG LX STRL 7.5 STRW (GLOVE) ×1 IMPLANT
GOWN STRL REUS W/ TWL LRG LVL3 (GOWN DISPOSABLE) ×2 IMPLANT
GOWN STRL REUS W/TWL LRG LVL3 (GOWN DISPOSABLE) ×4
LABEL CATARACT MEDS ST (LABEL) ×3 IMPLANT
PACK CATARACT (MISCELLANEOUS) ×3 IMPLANT
PACK CATARACT BRASINGTON LX (MISCELLANEOUS) ×3 IMPLANT
PACK EYE AFTER SURG (MISCELLANEOUS) ×3 IMPLANT
SOL BSS BAG (MISCELLANEOUS) ×3
SOLUTION BSS BAG (MISCELLANEOUS) ×1 IMPLANT
SYR 5ML LL (SYRINGE) ×3 IMPLANT
WATER STERILE IRR 250ML POUR (IV SOLUTION) ×3 IMPLANT
WIPE NON LINTING 3.25X3.25 (MISCELLANEOUS) ×3 IMPLANT

## 2017-07-12 NOTE — OR Nursing (Signed)
This RN called patients home at [phone] due to patient not showing up for his 0600 surgery. Lady answered the phone and stated that patient drove him self that he wouldn't let her bring him and he was being stubborn. Patient showed up explained to patient that he needed a ride home, said he would call the aid when he was done. This RN called the aid while patient was here, aid refused to come pick him up said " I can't do anything with him. I asked aid, so you are refusing to come pick him up from his surgery and she stated yes." patient called aid and she also refused to him she was coming. This RN walked patient out. Told him to call Dr Reinaldo RaddleBrasingtons office to reschedule and talk to the aid and ge stuff worked out.

## 2017-08-29 ENCOUNTER — Ambulatory Visit: Payer: Medicare Other | Admitting: Certified Registered Nurse Anesthetist

## 2017-08-29 ENCOUNTER — Encounter
Admission: RE | Disposition: A | Discharge status: Home / Self Care | Payer: Self-pay | Source: Ambulatory Visit | Attending: Ophthalmology

## 2017-08-29 ENCOUNTER — Ambulatory Visit
Admission: RE | Admit: 2017-08-29 | Discharge: 2017-08-29 | Disposition: A | Discharge status: Home / Self Care | Payer: Medicare Other | Source: Ambulatory Visit | Attending: Ophthalmology | Admitting: Ophthalmology

## 2017-08-29 ENCOUNTER — Encounter: Payer: Self-pay | Admitting: Certified Registered Nurse Anesthetist

## 2017-08-29 DIAGNOSIS — Z7982 Long term (current) use of aspirin: Secondary | ICD-10-CM | POA: Diagnosis not present

## 2017-08-29 DIAGNOSIS — H5703 Miosis: Secondary | ICD-10-CM | POA: Insufficient documentation

## 2017-08-29 DIAGNOSIS — E119 Type 2 diabetes mellitus without complications: Secondary | ICD-10-CM | POA: Diagnosis not present

## 2017-08-29 DIAGNOSIS — Z79899 Other long term (current) drug therapy: Secondary | ICD-10-CM | POA: Diagnosis not present

## 2017-08-29 DIAGNOSIS — I10 Essential (primary) hypertension: Secondary | ICD-10-CM | POA: Diagnosis not present

## 2017-08-29 DIAGNOSIS — Z7984 Long term (current) use of oral hypoglycemic drugs: Secondary | ICD-10-CM | POA: Diagnosis not present

## 2017-08-29 DIAGNOSIS — Z87891 Personal history of nicotine dependence: Secondary | ICD-10-CM | POA: Insufficient documentation

## 2017-08-29 DIAGNOSIS — K219 Gastro-esophageal reflux disease without esophagitis: Secondary | ICD-10-CM | POA: Diagnosis not present

## 2017-08-29 DIAGNOSIS — H2512 Age-related nuclear cataract, left eye: Secondary | ICD-10-CM | POA: Insufficient documentation

## 2017-08-29 HISTORY — PX: CATARACT EXTRACTION W/PHACO: SHX586

## 2017-08-29 LAB — GLUCOSE, CAPILLARY: Glucose-Capillary: 97 mg/dL (ref 65–99)

## 2017-08-29 SURGERY — PHACOEMULSIFICATION, CATARACT, WITH IOL INSERTION
Anesthesia: Monitor Anesthesia Care | Site: Eye | Laterality: Left | Wound class: Clean

## 2017-08-29 MED ORDER — EPINEPHRINE PF 1 MG/ML IJ SOLN
INTRAOCULAR | Status: DC | PRN
  Administered 2017-08-29: 08:00:00 via OPHTHALMIC

## 2017-08-29 MED ORDER — MOXIFLOXACIN HCL 0.5 % OP SOLN
OPHTHALMIC | Status: AC
  Administered 2017-08-29: 1 [drp] via OPHTHALMIC
  Filled 2017-08-29: qty 3

## 2017-08-29 MED ORDER — LIDOCAINE HCL (PF) 4 % IJ SOLN
INTRAOCULAR | Status: DC | PRN
  Administered 2017-08-29: 4 mL via OPHTHALMIC

## 2017-08-29 MED ORDER — TRYPAN BLUE 0.06 % OP SOLN
OPHTHALMIC | Status: DC | PRN
  Administered 2017-08-29: 0.5 mL via INTRAOCULAR

## 2017-08-29 MED ORDER — CARBACHOL 0.01 % IO SOLN
INTRAOCULAR | Status: DC | PRN
  Administered 2017-08-29: 0.5 mL via INTRAOCULAR

## 2017-08-29 MED ORDER — ARMC OPHTHALMIC DILATING DROPS
1.0000 "application " | OPHTHALMIC | Status: AC
  Administered 2017-08-29 (×3): 1 via OPHTHALMIC

## 2017-08-29 MED ORDER — SODIUM HYALURONATE 23 MG/ML IO SOLN
INTRAOCULAR | Status: AC
  Filled 2017-08-29: qty 0.6

## 2017-08-29 MED ORDER — SODIUM CHLORIDE 0.9 % IV SOLN
INTRAVENOUS | Status: DC
  Administered 2017-08-29 (×2): via INTRAVENOUS

## 2017-08-29 MED ORDER — ARMC OPHTHALMIC DILATING DROPS
OPHTHALMIC | Status: AC
  Administered 2017-08-29: 1 via OPHTHALMIC
  Filled 2017-08-29: qty 0.4

## 2017-08-29 MED ORDER — NEOMYCIN-POLYMYXIN-DEXAMETH 0.1 % OP OINT
TOPICAL_OINTMENT | OPHTHALMIC | Status: DC | PRN
  Administered 2017-08-29: 1 via OPHTHALMIC

## 2017-08-29 MED ORDER — MOXIFLOXACIN HCL 0.5 % OP SOLN
1.0000 [drp] | OPHTHALMIC | Status: DC | PRN
  Administered 2017-08-29 (×2): 1 [drp] via OPHTHALMIC

## 2017-08-29 MED ORDER — POVIDONE-IODINE 5 % OP SOLN
OPHTHALMIC | Status: DC | PRN
  Administered 2017-08-29: 1 via OPHTHALMIC

## 2017-08-29 MED ORDER — SODIUM HYALURONATE 23 MG/ML IO SOLN
INTRAOCULAR | Status: DC | PRN
  Administered 2017-08-29: 0.6 mL via INTRAOCULAR

## 2017-08-29 MED ORDER — NA HYALUR & NA CHOND-NA HYALUR 0.4-0.35 ML IO KIT
PACK | INTRAOCULAR | Status: DC | PRN
  Administered 2017-08-29: .35 mL via INTRAOCULAR

## 2017-08-29 SURGICAL SUPPLY — 21 items
BANDAGE EYE OVAL (MISCELLANEOUS) ×6 IMPLANT
DRAIN EYE SPONGE 80CC SGL (MISCELLANEOUS) ×3 IMPLANT
GLOVE BIO SURGEON STRL SZ8 (GLOVE) ×3 IMPLANT
GLOVE BIOGEL M 6.5 STRL (GLOVE) ×3 IMPLANT
GLOVE SURG LX 7.5 STRW (GLOVE) ×2
GLOVE SURG LX STRL 7.5 STRW (GLOVE) ×1 IMPLANT
GOWN STRL REUS W/ TWL LRG LVL3 (GOWN DISPOSABLE) ×2 IMPLANT
GOWN STRL REUS W/TWL LRG LVL3 (GOWN DISPOSABLE) ×4
LABEL CATARACT MEDS ST (LABEL) ×3 IMPLANT
LENS IOL TECNIS ITEC 20.0 (Intraocular Lens) ×3 IMPLANT
NDL HPO THNWL 1X22GA REG BVL (NEEDLE) ×1 IMPLANT
NEEDLE SAFETY 22GX1 (NEEDLE) ×2
PACK CATARACT (MISCELLANEOUS) ×3 IMPLANT
PACK CATARACT BRASINGTON LX (MISCELLANEOUS) ×3 IMPLANT
PACK EYE AFTER SURG (MISCELLANEOUS) ×3 IMPLANT
RING MALYGIN (MISCELLANEOUS) ×3 IMPLANT
SOL BSS BAG (MISCELLANEOUS) ×3
SOLUTION BSS BAG (MISCELLANEOUS) ×1 IMPLANT
SYR 5ML LL (SYRINGE) ×3 IMPLANT
WATER STERILE IRR 250ML POUR (IV SOLUTION) ×3 IMPLANT
WIPE NON LINTING 3.25X3.25 (MISCELLANEOUS) ×3 IMPLANT

## 2017-08-29 NOTE — Anesthesia Preprocedure Evaluation (Addendum)
Anesthesia Evaluation  Patient identified by MRN, date of birth, ID band Patient awake    Reviewed: Allergy & Precautions, H&P , NPO status , reviewed documented beta blocker date and time   Airway Mallampati: II  TM Distance: >3 FB Neck ROM: limited    Dental  (+) Edentulous Lower, Edentulous Upper   Pulmonary former smoker,    Pulmonary exam normal        Cardiovascular hypertension, Normal cardiovascular exam  Impressions:  - Normal study. No cardiac source of emboli was indentified. normal   overall left ventricular function   Normal wall motion 55%   Possible septal basal questionable hypo-   Conclusion basically normal echo.  PVCs on EKG   Neuro/Psych PSYCHIATRIC DISORDERS Dementia    GI/Hepatic GERD  Controlled,  Endo/Other  diabetes  Renal/GU      Musculoskeletal   Abdominal   Peds  Hematology  (+) anemia ,   Anesthesia Other Findings   Reproductive/Obstetrics                             Anesthesia Physical Anesthesia Plan  ASA: III  Anesthesia Plan: MAC   Post-op Pain Management:    Induction:   PONV Risk Score and Plan: Propofol infusion  Airway Management Planned:   Additional Equipment:   Intra-op Plan:   Post-operative Plan:   Informed Consent: I have reviewed the patients History and Physical, chart, labs and discussed the procedure including the risks, benefits and alternatives for the proposed anesthesia with the patient or authorized representative who has indicated his/her understanding and acceptance.   Dental Advisory Given  Plan Discussed with: CRNA  Anesthesia Plan Comments:         Anesthesia Quick Evaluation

## 2017-08-29 NOTE — Discharge Instructions (Signed)

## 2017-08-29 NOTE — Anesthesia Post-op Follow-up Note (Signed)
Anesthesia QCDR form completed.        

## 2017-08-29 NOTE — Anesthesia Postprocedure Evaluation (Signed)
Anesthesia Post Note  Patient: Ruben Ruiz  Procedure(s) Performed: CATARACT EXTRACTION PHACO AND INTRAOCULAR LENS PLACEMENT ([place]) (Left Eye)  Patient location during evaluation: PACU Anesthesia Type: MAC Level of consciousness: awake Pain management: pain level controlled Vital Signs Assessment: post-procedure vital signs reviewed and stable Respiratory status: spontaneous breathing Cardiovascular status: blood pressure returned to baseline Postop Assessment: no apparent nausea or vomiting Anesthetic complications: no     Last Vitals:  Vitals:   08/29/17 0657  BP: (!) 176/94  Pulse: 97  Resp: 18  Temp: (!) 36.4 C  SpO2: 94%    Last Pain:  Vitals:   08/29/17 0657  TempSrc: Oral  PainSc: 0-No pain                 Carron Curie

## 2017-08-29 NOTE — Transfer of Care (Signed)
Immediate Anesthesia Transfer of Care Note  Patient: Ruben Ruiz  Procedure(s) Performed: CATARACT EXTRACTION PHACO AND INTRAOCULAR LENS PLACEMENT ([facility]) (Left Eye)  Patient Location: PACU  Anesthesia Type:MAC  Level of Consciousness: awake  Airway & Oxygen Therapy: Patient Spontanous Breathing  Post-op Assessment: Report given to RN  Post vital signs: stable  Last Vitals:  Vitals Value Taken Time  BP    Temp    Pulse    Resp    SpO2      Last Pain:  Vitals:   08/29/17 0657  TempSrc: Oral  PainSc: 0-No pain         Complications: No apparent anesthesia complications

## 2017-08-29 NOTE — Op Note (Signed)
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     PROCEDURE:  Phacoemulsification with posterior chamber intraocular lens implantation of the left eye which required pupil stretching with the Malyugin pupil expansion device and staining with vision blue dye   LENS:   Implant Name Type Inv. Item Serial No. Manufacturer Lot No. LRB No. Used  LENS IOL DIOP 20.0 - [id number] Intraocular Lens LENS IOL DIOP 20.0 [id number]  Left 1        ULTRASOUND TIME: 31 % of 1 minutes, 45 seconds.  CDE 24.2   SURGEON:  Deirdre Evener, MD   ANESTHESIA: Topical with tetracaine drops and 2% Xylocaine jelly, augmented with 1% preservative-free intracameral lidocaine.   COMPLICATIONS:  None.   DESCRIPTION OF PROCEDURE:  The patient was identified in the holding room and transported to the operating room and placed in the supine position under the operating microscope.  The left eye was identified as the operative eye and it was prepped and draped in the usual sterile ophthalmic fashion.   A 1 millimeter clear-corneal paracentesis was made at the 1:30 position.  The anterior chamber was filled with Healon 5 viscoelastic. Vision was placed into the anterior chamber to stain the anterior capsule.  It was washed out with balanced salt solution.  0.5 ml of preservative-free 1% lidocaine was injected into the anterior chamber. Healon 5 was placed into the anterior chamber. A 2.4 millimeter keratome was used to make a near-clear corneal incision at the 10:30 position.  A Malyugin pupil expander was then placed through the main incision and into the anterior chamber of the eye.  The edge of the iris was secured on the lip of the pupil expander and it was released, thereby expanding the pupil to approximately 7  millimeters for completion of the cataract surgery.  Additional Viscoat was placed in the anterior chamber.  A cystotome and capsulorrhexis forceps were used to make a curvilinear capsulorrhexis.   Balanced salt solution was used to hydrodissect and hydrodelineate the lens nucleus.   Phacoemulsification was used in stop and chop fashion to remove the lens, nucleus and epinucleus.  The remaining cortex was aspirated using the irrigation aspiration handpiece.  Additional Provisc was placed into the eye to distend the capsular bag for lens placement.  A lens was then injected into the capsular bag.  The pupil expanding ring was removed using a Kuglen hook and insertion device. The remaining viscoelastic was aspirated from the capsular bag and the anterior chamber.  The anterior chamber was filled with balanced salt solution to inflate to a physiologic pressure.   Wounds were hydrated with balanced salt solution.  The anterior chamber was inflated to a physiologic pressure with balanced salt solution.  No wound leaks were noted. Vigamox 0.2 ml of a  per ml solution was injected into the anterior chamber for a dose of 0.2 mg of intracameral antibiotic at the completion of the case.   Timolol and Brimonidine drops were applied to the eye.  The patient was taken to the recovery room in stable condition without complications of anesthesia or surgery.  Ruben Ruiz 08/29/2017, 8:10 AM

## 2017-08-29 NOTE — H&P (Signed)
The History and Physical notes are on paper, have been signed, and are to be scanned. The patient remains stable and unchanged from the H&P.   Previous H&P reviewed, patient examined, and there are no changes.  Ruben Ruiz 08/29/2017 7:26 AM

## 2017-10-11 ENCOUNTER — Emergency Department: Payer: Medicare Other

## 2017-10-11 ENCOUNTER — Encounter: Payer: Self-pay | Admitting: Intensive Care

## 2017-10-11 ENCOUNTER — Other Ambulatory Visit: Payer: Self-pay

## 2017-10-11 ENCOUNTER — Emergency Department
Admission: EM | Admit: 2017-10-11 | Discharge: 2017-10-11 | Disposition: A | Discharge status: Home / Self Care | Payer: Medicare Other | Attending: Emergency Medicine | Admitting: Emergency Medicine

## 2017-10-11 DIAGNOSIS — F0390 Unspecified dementia without behavioral disturbance: Secondary | ICD-10-CM | POA: Insufficient documentation

## 2017-10-11 DIAGNOSIS — Z7984 Long term (current) use of oral hypoglycemic drugs: Secondary | ICD-10-CM | POA: Diagnosis not present

## 2017-10-11 DIAGNOSIS — E119 Type 2 diabetes mellitus without complications: Secondary | ICD-10-CM | POA: Diagnosis not present

## 2017-10-11 DIAGNOSIS — Z79899 Other long term (current) drug therapy: Secondary | ICD-10-CM | POA: Diagnosis not present

## 2017-10-11 DIAGNOSIS — Z87891 Personal history of nicotine dependence: Secondary | ICD-10-CM | POA: Insufficient documentation

## 2017-10-11 DIAGNOSIS — J984 Other disorders of lung: Secondary | ICD-10-CM

## 2017-10-11 DIAGNOSIS — I10 Essential (primary) hypertension: Secondary | ICD-10-CM | POA: Insufficient documentation

## 2017-10-11 DIAGNOSIS — Z7982 Long term (current) use of aspirin: Secondary | ICD-10-CM | POA: Insufficient documentation

## 2017-10-11 DIAGNOSIS — J189 Pneumonia, unspecified organism: Secondary | ICD-10-CM | POA: Diagnosis not present

## 2017-10-11 DIAGNOSIS — R42 Dizziness and giddiness: Secondary | ICD-10-CM | POA: Diagnosis not present

## 2017-10-11 LAB — CBC
HCT: 42.4 % (ref 40.0–52.0)
Hemoglobin: 14.4 g/dL (ref 13.0–18.0)
MCH: 28.3 pg (ref 26.0–34.0)
MCHC: 33.9 g/dL (ref 32.0–36.0)
MCV: 83.4 fL (ref 80.0–100.0)
PLATELETS: 751 10*3/uL — AB (ref 150–440)
RBC: 5.09 MIL/uL (ref 4.40–5.90)
RDW: 14.2 % (ref 11.5–14.5)
WBC: 4.7 10*3/uL (ref 3.8–10.6)

## 2017-10-11 LAB — URINALYSIS, COMPLETE (UACMP) WITH MICROSCOPIC
Bacteria, UA: NONE SEEN
Bilirubin Urine: NEGATIVE
GLUCOSE, UA: NEGATIVE mg/dL
Hgb urine dipstick: NEGATIVE
Ketones, ur: NEGATIVE mg/dL
Leukocytes, UA: NEGATIVE
Nitrite: NEGATIVE
PH: 5 (ref 5.0–8.0)
Protein, ur: NEGATIVE mg/dL
SPECIFIC GRAVITY, URINE: 1.015 (ref 1.005–1.030)

## 2017-10-11 LAB — BASIC METABOLIC PANEL
Anion gap: 7 (ref 5–15)
BUN: 14 mg/dL (ref 6–20)
CALCIUM: 9.2 mg/dL (ref 8.9–10.3)
CO2: 26 mmol/L (ref 22–32)
CREATININE: 1.09 mg/dL (ref 0.61–1.24)
Chloride: 101 mmol/L (ref 101–111)
GFR calc non Af Amer: 55 mL/min — ABNORMAL LOW (ref >60)
GLUCOSE: 128 mg/dL — AB (ref 65–99)
Potassium: 3.4 mmol/L — ABNORMAL LOW (ref 3.5–5.1)
Sodium: 134 mmol/L — ABNORMAL LOW (ref 135–145)

## 2017-10-11 LAB — TROPONIN I: Troponin I: <0.03 ng/mL (ref <0.03)

## 2017-10-11 MED ORDER — AZITHROMYCIN 250 MG PO TABS: ORAL_TABLET | ORAL | 0 refills | Status: AC

## 2017-10-11 NOTE — ED Triage Notes (Addendum)
Patient reports for the last two weeks every morning when he wakes up he has a bad headache and can barely walk from his headache being so bad. He also feels dizzy at that time. Also reports having spots in vision X2 weeks now. Was seen by PCP yesterday. Was told to come to ER today if not better

## 2017-10-11 NOTE — ED Provider Notes (Signed)
[facility] Emergency Department Provider Note  Time seen: 9:38 AM  I have reviewed the triage vital signs and the nursing notes.   HISTORY  Chief Complaint Dizziness and Headache    HPI Ruben Ruiz is a 84 y.o. male with a past medical history of dementia, diabetes, gastric reflux, hypertension, hard of hearing, presents to the emergency department for dizziness.  According to the patient this morning he was feeling dizzy, states that has been going on on and off for the past week or 2 will he will develop occasional dizziness and a headache.  Also states at times he will feel short of breath.  Patient saw his doctor for the same recently, but was told to come to the ER for continues to happen.  Patient denies any weakness or numbness.  States the headache is gone and the dizziness is gone.  Denies any trouble breathing or chest pain.  Patient is able to walk to the room himself, very well-appearing for age, strong for age.   Past Medical History:  Diagnosis Date  . Dementia   . Diabetes mellitus without complication (HCC)   . GERD (gastroesophageal reflux disease)   . HOH (hard of hearing)    AID  . Hypertension     Patient Active Problem List   Diagnosis Date Noted  . Hypertensive urgency 06/20/2015  . Hypoglycemia associated with diabetes (HCC) 06/20/2015  . Anemia 06/20/2015  . Syncope 06/18/2015    Past Surgical History:  Procedure Laterality Date  . CATARACT EXTRACTION W/PHACO Left 08/29/2017   Procedure: CATARACT EXTRACTION PHACO AND INTRAOCULAR LENS PLACEMENT ([facility]);  Surgeon: Lockie MolaBrasington, Chadwick, MD;  Location: [facility] ORS;  Service: Ophthalmology;  Laterality: Left;  US 01:45.0 AP% 31.2 CDE 24.15 Fluid Pack Lot # [id number] H  . EYE SURGERY      Prior to Admission medications   Medication Sig Start Date End Date Taking? Authorizing Provider  amLODipine (NORVASC) 5 MG tablet Take 5 mg by mouth daily. 09/29/16   [provider]  aspirin  EC 81 MG tablet Take 81 mg by mouth daily.    [provider]  GLIPIZIDE XL 10 MG 24 hr tablet Take 10 mg by mouth daily. 06/29/17   [provider]  losartan (COZAAR) 100 MG tablet Take 100 mg by mouth at bedtime.     [provider]  meclizine (ANTIVERT) 25 MG tablet Take 1 tablet (25 mg total) by mouth 3 (three) times daily as needed for dizziness. 07/03/15   Phineas SemenGoodman, Graydon, MD  metFORMIN (GLUCOPHAGE) 1000 MG tablet Take 1,000 mg by mouth daily. 09/29/16   [provider]  omeprazole (PRILOSEC) 20 MG capsule Take 40 mg by mouth at bedtime.     [provider]  pravastatin (PRAVACHOL) 20 MG tablet Take 20 mg by mouth at bedtime.     [provider]    No Known Allergies  History reviewed. No pertinent family history.  Social History Social History   Tobacco Use  . Smoking status: Former Games developermoker  . Smokeless tobacco: Never Used  Substance Use Topics  . Alcohol use: No  . Drug use: Not on file    Review of Systems Constitutional: Negative for fever. Eyes: Occasional spots in vision ENT: Negative for recent illness/congestion Cardiovascular: Negative for chest pain. Respiratory: Occasional shortness of breath, none currently. Gastrointestinal: Negative for abdominal pain, vomiting Genitourinary: Negative for urinary compaints Musculoskeletal: Negative for musculoskeletal complaints Skin: Negative for skin complaints  Neurological: Intermittent headache and  dizziness over the past 2 weeks. All other ROS negative  ____________________________________________   PHYSICAL EXAM:  VITAL SIGNS: ED Triage Vitals  Enc Vitals Group     BP 10/11/17 0920 (!) 177/94     Pulse Rate 10/11/17 0920 69     Resp 10/11/17 0920 16     Temp 10/11/17 0920 97.6 F (36.4 C)     Temp Source 10/11/17 0920 Oral     SpO2 10/11/17 0920 95 %     Weight 10/11/17 0921 165 lb (74.8 kg)     Height 10/11/17 0921 5\' 9"  (1.753 m)     Head Circumference  --      Peak Flow --      Pain Score --      Pain Loc --      Pain Edu? --      Excl. in GC? --    Constitutional: Alert. Well appearing and in no distress. Eyes: Normal exam ENT   Head: Normocephalic and atraumatic   Mouth/Throat: Mucous membranes are moist. Cardiovascular: Normal rate, regular rhythm. Respiratory: Normal respiratory effort without tachypnea nor retractions. Breath sounds are clear  Gastrointestinal: Soft and nontender. No distention.  Musculoskeletal: Nontender with normal range of motion in all extremities.  Neurologic:  Normal speech and language. No gross focal neurologic deficits.  Equal grip strength bilaterally.  5/5 motor in all extremities.  Very strong for age. Skin:  Skin is warm, dry and intact.  Psychiatric: Mood and affect are normal.   ____________________________________________    EKG  EKG reviewed and interpreted by myself shows normal sinus rhythm at 68 bpm with a narrow QRS, normal axis, normal intervals, no concerning ST changes.  ____________________________________________    RADIOLOGY  CT shows no acute abnormality. Chest x-ray shows possible pneumonitis.  ____________________________________________   INITIAL IMPRESSION / ASSESSMENT AND PLAN / ED COURSE  Pertinent labs & imaging results that were available during my care of the patient were reviewed by me and considered in my medical decision making (see chart for details).  Patient presents to the emergency department for intermittent dizziness over the past 2 weeks with occasional headaches and shortness of breath.  Overall the patient appears very well currently, no distress.  States he feels much better, denies any headache dizziness or shortness of breath at this time.  Given the patient's symptoms we will check labs, CT scan of head and a chest x-ray.  Differential would include electrolyte or metabolic abnormality, urinary tract infection, CVA or ICH, pneumonia,  ACS.  Chest x-ray shows possible pneumonitis.  Urinalysis is normal.  Demonstrate and CBC are largely at baseline.  Platelet count is elevated but this appears unchanged from prior.  We will cover with a course of Zithromax for possible pneumonitis.  Otherwise we will have the patient follow-up with his doctor.  Patient agreeable to plan of care.  Continues to state he feels very well.  ____________________________________________   FINAL CLINICAL IMPRESSION(S) / ED DIAGNOSES  Dizziness Pneumonitis   Minna Antis, MD 10/11/17 [phone]

## 2018-04-14 ENCOUNTER — Emergency Department: Payer: Medicare Other

## 2018-04-14 ENCOUNTER — Inpatient Hospital Stay
Admission: EM | Admit: 2018-04-14 | Discharge: 2018-04-16 | DRG: 176 | Disposition: A | Discharge status: Home / Self Care | Payer: Medicare Other | Attending: Internal Medicine | Admitting: Internal Medicine

## 2018-04-14 ENCOUNTER — Other Ambulatory Visit: Payer: Self-pay

## 2018-04-14 ENCOUNTER — Encounter: Payer: Self-pay | Admitting: Emergency Medicine

## 2018-04-14 DIAGNOSIS — I2699 Other pulmonary embolism without acute cor pulmonale: Principal | ICD-10-CM | POA: Diagnosis present

## 2018-04-14 DIAGNOSIS — F0390 Unspecified dementia without behavioral disturbance: Secondary | ICD-10-CM | POA: Diagnosis present

## 2018-04-14 DIAGNOSIS — Z87891 Personal history of nicotine dependence: Secondary | ICD-10-CM

## 2018-04-14 DIAGNOSIS — K219 Gastro-esophageal reflux disease without esophagitis: Secondary | ICD-10-CM | POA: Diagnosis present

## 2018-04-14 DIAGNOSIS — E039 Hypothyroidism, unspecified: Secondary | ICD-10-CM | POA: Diagnosis present

## 2018-04-14 DIAGNOSIS — R609 Edema, unspecified: Secondary | ICD-10-CM

## 2018-04-14 DIAGNOSIS — I10 Essential (primary) hypertension: Secondary | ICD-10-CM | POA: Diagnosis present

## 2018-04-14 DIAGNOSIS — R0902 Hypoxemia: Secondary | ICD-10-CM | POA: Diagnosis present

## 2018-04-14 DIAGNOSIS — I2609 Other pulmonary embolism with acute cor pulmonale: Secondary | ICD-10-CM

## 2018-04-14 DIAGNOSIS — Z7984 Long term (current) use of oral hypoglycemic drugs: Secondary | ICD-10-CM

## 2018-04-14 DIAGNOSIS — Z79899 Other long term (current) drug therapy: Secondary | ICD-10-CM

## 2018-04-14 DIAGNOSIS — N179 Acute kidney failure, unspecified: Secondary | ICD-10-CM | POA: Diagnosis present

## 2018-04-14 DIAGNOSIS — H9190 Unspecified hearing loss, unspecified ear: Secondary | ICD-10-CM | POA: Diagnosis present

## 2018-04-14 DIAGNOSIS — E785 Hyperlipidemia, unspecified: Secondary | ICD-10-CM | POA: Diagnosis present

## 2018-04-14 DIAGNOSIS — E119 Type 2 diabetes mellitus without complications: Secondary | ICD-10-CM | POA: Diagnosis present

## 2018-04-14 LAB — COMPREHENSIVE METABOLIC PANEL
ALBUMIN: 3.7 g/dL (ref 3.5–5.0)
ALT: 14 U/L (ref 0–44)
AST: 22 U/L (ref 15–41)
Alkaline Phosphatase: 74 U/L (ref 38–126)
Anion gap: 7 (ref 5–15)
BILIRUBIN TOTAL: 0.6 mg/dL (ref 0.3–1.2)
BUN: 17 mg/dL (ref 8–23)
CHLORIDE: 103 mmol/L (ref 98–111)
CO2: 25 mmol/L (ref 22–32)
CREATININE: 1.3 mg/dL — AB (ref 0.61–1.24)
Calcium: 8.9 mg/dL (ref 8.9–10.3)
GFR calc Af Amer: 53 mL/min — ABNORMAL LOW (ref >60)
GFR calc non Af Amer: 45 mL/min — ABNORMAL LOW (ref >60)
Glucose, Bld: 156 mg/dL — ABNORMAL HIGH (ref 70–99)
POTASSIUM: 3.6 mmol/L (ref 3.5–5.1)
Sodium: 135 mmol/L (ref 135–145)
TOTAL PROTEIN: 7.1 g/dL (ref 6.5–8.1)

## 2018-04-14 LAB — CBC
HEMATOCRIT: 42.8 % (ref 39.0–52.0)
HEMOGLOBIN: 13.5 g/dL (ref 13.0–17.0)
MCH: 26.8 pg (ref 26.0–34.0)
MCHC: 31.5 g/dL (ref 30.0–36.0)
MCV: 85.1 fL (ref 80.0–100.0)
Platelets: 229 10*3/uL (ref 150–400)
RBC: 5.03 MIL/uL (ref 4.22–5.81)
RDW: 13.2 % (ref 11.5–15.5)
WBC: 5.3 10*3/uL (ref 4.0–10.5)
nRBC: 0 % (ref 0.0–0.2)

## 2018-04-14 LAB — LIPASE, BLOOD: Lipase: 25 U/L (ref 11–51)

## 2018-04-14 LAB — TROPONIN I: TROPONIN I: 0.03 ng/mL — AB (ref <0.03)

## 2018-04-14 NOTE — ED Triage Notes (Signed)
Pt reports left upper abd pain, "It's choking up" and he's having trouble catching his breath; pt awake and alert; pt says it's 'been building up" for several days; denies chest pain; denies N/V/D;

## 2018-04-14 NOTE — ED Notes (Signed)
Pt is a little hard to understand, but per the person in room with him, she states that after he eats he chokes. Unsure if it is during the act of eating or like a reflux reaction afterwards. Labs were drawn in triage. Pt resting comfortably

## 2018-04-14 NOTE — ED Notes (Signed)
Elevated Troponin 0.03 reported to TracyDawn, Charity fundraiserN First Nurse

## 2018-04-15 ENCOUNTER — Other Ambulatory Visit: Payer: Self-pay

## 2018-04-15 ENCOUNTER — Encounter: Payer: Self-pay | Admitting: Radiology

## 2018-04-15 ENCOUNTER — Inpatient Hospital Stay: Payer: Medicare Other

## 2018-04-15 ENCOUNTER — Emergency Department: Payer: Medicare Other

## 2018-04-15 ENCOUNTER — Inpatient Hospital Stay
Admit: 2018-04-15 | Discharge: 2018-04-15 | Disposition: A | Discharge status: Home / Self Care | Payer: Medicare Other | Attending: Internal Medicine | Admitting: Internal Medicine

## 2018-04-15 DIAGNOSIS — F0390 Unspecified dementia without behavioral disturbance: Secondary | ICD-10-CM | POA: Diagnosis present

## 2018-04-15 DIAGNOSIS — K219 Gastro-esophageal reflux disease without esophagitis: Secondary | ICD-10-CM | POA: Diagnosis present

## 2018-04-15 DIAGNOSIS — Z79899 Other long term (current) drug therapy: Secondary | ICD-10-CM | POA: Diagnosis not present

## 2018-04-15 DIAGNOSIS — H9190 Unspecified hearing loss, unspecified ear: Secondary | ICD-10-CM | POA: Diagnosis present

## 2018-04-15 DIAGNOSIS — R0902 Hypoxemia: Secondary | ICD-10-CM | POA: Diagnosis present

## 2018-04-15 DIAGNOSIS — I2699 Other pulmonary embolism without acute cor pulmonale: Secondary | ICD-10-CM | POA: Diagnosis present

## 2018-04-15 DIAGNOSIS — Z7984 Long term (current) use of oral hypoglycemic drugs: Secondary | ICD-10-CM | POA: Diagnosis not present

## 2018-04-15 DIAGNOSIS — N179 Acute kidney failure, unspecified: Secondary | ICD-10-CM | POA: Diagnosis present

## 2018-04-15 DIAGNOSIS — I10 Essential (primary) hypertension: Secondary | ICD-10-CM | POA: Diagnosis present

## 2018-04-15 DIAGNOSIS — E039 Hypothyroidism, unspecified: Secondary | ICD-10-CM | POA: Diagnosis present

## 2018-04-15 DIAGNOSIS — Z87891 Personal history of nicotine dependence: Secondary | ICD-10-CM | POA: Diagnosis not present

## 2018-04-15 DIAGNOSIS — E119 Type 2 diabetes mellitus without complications: Secondary | ICD-10-CM | POA: Diagnosis present

## 2018-04-15 DIAGNOSIS — E785 Hyperlipidemia, unspecified: Secondary | ICD-10-CM | POA: Diagnosis present

## 2018-04-15 LAB — TROPONIN I
Troponin I: 0.04 ng/mL (ref <0.03)
Troponin I: 0.03 ng/mL (ref <0.03)
Troponin I: <0.03 ng/mL (ref <0.03)
Troponin I: 0.03 ng/mL (ref <0.03)

## 2018-04-15 LAB — GLUCOSE, CAPILLARY
Glucose-Capillary: 82 mg/dL (ref 70–99)
Glucose-Capillary: 125 mg/dL — ABNORMAL HIGH (ref 70–99)
Glucose-Capillary: 79 mg/dL (ref 70–99)
Glucose-Capillary: 106 mg/dL — ABNORMAL HIGH (ref 70–99)

## 2018-04-15 LAB — TSH: TSH: 11.294 u[IU]/mL — ABNORMAL HIGH (ref 0.350–4.500)

## 2018-04-15 LAB — APTT: aPTT: 38 seconds — ABNORMAL HIGH (ref 24–36)

## 2018-04-15 LAB — T4, FREE: Free T4: 0.8 ng/dL — ABNORMAL LOW (ref 0.82–1.77)

## 2018-04-15 LAB — PROTIME-INR
INR: 1.14
Prothrombin Time: 14.5 seconds (ref 11.4–15.2)

## 2018-04-15 LAB — CG4 I-STAT (LACTIC ACID): Lactic Acid, Venous: 1.32 mmol/L (ref 0.5–1.9)

## 2018-04-15 MED ORDER — IOPAMIDOL (ISOVUE-370) INJECTION 76%: 100.0000 mL | Freq: Once | INTRAVENOUS | Status: DC | PRN

## 2018-04-15 MED ORDER — PRAVASTATIN SODIUM 20 MG PO TABS
20.0000 mg | ORAL_TABLET | Freq: Every day | ORAL | Status: DC
  Administered 2018-04-15: 20 mg via ORAL
  Filled 2018-04-15: qty 1

## 2018-04-15 MED ORDER — ACETAMINOPHEN 650 MG RE SUPP: 650.0000 mg | Freq: Four times a day (QID) | RECTAL | Status: DC | PRN

## 2018-04-15 MED ORDER — ONDANSETRON HCL 4 MG/2ML IJ SOLN: 4.0000 mg | Freq: Four times a day (QID) | INTRAMUSCULAR | Status: DC | PRN

## 2018-04-15 MED ORDER — IOPAMIDOL (ISOVUE-370) INJECTION 76%: 75.0000 mL | Freq: Once | INTRAVENOUS | Status: DC | PRN

## 2018-04-15 MED ORDER — DOCUSATE SODIUM 100 MG PO CAPS
100.0000 mg | ORAL_CAPSULE | Freq: Two times a day (BID) | ORAL | Status: DC
  Administered 2018-04-15 – 2018-04-16 (×3): 100 mg via ORAL
  Filled 2018-04-15 (×3): qty 1

## 2018-04-15 MED ORDER — AMLODIPINE BESYLATE 5 MG PO TABS
5.0000 mg | ORAL_TABLET | Freq: Every day | ORAL | Status: DC
  Administered 2018-04-15 – 2018-04-16 (×2): 5 mg via ORAL
  Filled 2018-04-15 (×2): qty 1

## 2018-04-15 MED ORDER — APIXABAN 5 MG PO TABS: 5.0000 mg | ORAL_TABLET | Freq: Two times a day (BID) | ORAL | Status: DC

## 2018-04-15 MED ORDER — HEPARIN (PORCINE) 25000 UT/250ML-% IV SOLN: 1300.0000 [IU]/h | INTRAVENOUS | Status: DC

## 2018-04-15 MED ORDER — LEVOTHYROXINE SODIUM 50 MCG PO TABS
50.0000 ug | ORAL_TABLET | Freq: Every day | ORAL | Status: DC
  Administered 2018-04-16: 50 ug via ORAL
  Filled 2018-04-15: qty 1

## 2018-04-15 MED ORDER — PANTOPRAZOLE SODIUM 40 MG PO TBEC
40.0000 mg | DELAYED_RELEASE_TABLET | Freq: Every day | ORAL | Status: DC
  Administered 2018-04-15 – 2018-04-16 (×2): 40 mg via ORAL
  Filled 2018-04-15 (×2): qty 1

## 2018-04-15 MED ORDER — ASPIRIN EC 81 MG PO TBEC
81.0000 mg | DELAYED_RELEASE_TABLET | Freq: Every day | ORAL | Status: DC
  Administered 2018-04-15 – 2018-04-16 (×2): 81 mg via ORAL
  Filled 2018-04-15 (×2): qty 1

## 2018-04-15 MED ORDER — SODIUM CHLORIDE 0.9 % IV SOLN
INTRAVENOUS | Status: DC
  Administered 2018-04-15: 05:00:00 via INTRAVENOUS

## 2018-04-15 MED ORDER — IPRATROPIUM-ALBUTEROL 0.5-2.5 (3) MG/3ML IN SOLN
3.0000 mL | Freq: Four times a day (QID) | RESPIRATORY_TRACT | Status: DC | PRN
  Administered 2018-04-15: 3 mL via RESPIRATORY_TRACT
  Filled 2018-04-15: qty 3

## 2018-04-15 MED ORDER — ACETAMINOPHEN 325 MG PO TABS: 650.0000 mg | ORAL_TABLET | Freq: Four times a day (QID) | ORAL | Status: DC | PRN

## 2018-04-15 MED ORDER — APIXABAN 5 MG PO TABS
10.0000 mg | ORAL_TABLET | Freq: Two times a day (BID) | ORAL | Status: DC
  Administered 2018-04-15 – 2018-04-16 (×3): 10 mg via ORAL
  Filled 2018-04-15 (×3): qty 2

## 2018-04-15 MED ORDER — INSULIN ASPART 100 UNIT/ML ~~LOC~~ SOLN: 0.0000 [IU] | Freq: Every day | SUBCUTANEOUS | Status: DC

## 2018-04-15 MED ORDER — HEPARIN BOLUS VIA INFUSION: 4000.0000 [IU] | Freq: Once | INTRAVENOUS | Status: DC

## 2018-04-15 MED ORDER — ONDANSETRON HCL 4 MG PO TABS: 4.0000 mg | ORAL_TABLET | Freq: Four times a day (QID) | ORAL | Status: DC | PRN

## 2018-04-15 MED ORDER — IOPAMIDOL (ISOVUE-370) INJECTION 76%
75.0000 mL | Freq: Once | INTRAVENOUS | Status: AC | PRN
  Administered 2018-04-15: 100 mL via INTRAVENOUS

## 2018-04-15 MED ORDER — INSULIN ASPART 100 UNIT/ML ~~LOC~~ SOLN
0.0000 [IU] | Freq: Three times a day (TID) | SUBCUTANEOUS | Status: DC
  Administered 2018-04-15 – 2018-04-16 (×2): 1 [IU] via SUBCUTANEOUS
  Filled 2018-04-15 (×2): qty 1

## 2018-04-15 MED ORDER — LOSARTAN POTASSIUM 50 MG PO TABS
100.0000 mg | ORAL_TABLET | Freq: Every day | ORAL | Status: DC
  Administered 2018-04-15: 100 mg via ORAL
  Filled 2018-04-15: qty 2

## 2018-04-15 NOTE — ED Provider Notes (Signed)
[facility] Emergency Department Provider Note  ____________________________________________   First MD Initiated Contact with Patient 04/14/18 2323     (approximate)  I have reviewed the triage vital signs and the nursing notes.   HISTORY  Chief Complaint Abdominal Pain and Shortness of Breath   HPI Ruben Ruiz is a 84 y.o. male who comes to the emergency department with 3 days of acute shortness of breath.  His symptoms came on suddenly and have been mostly constant.  They are worse with exertion and minimally improved with rest.  He denies fevers or chills.  He does have intermittent dry cough.  He denies chest pain but does note some upper abdominal discomfort.  Is not postprandial.  He denies nausea or vomiting.  Denies leg swelling.  No recent surgery travel or immobilization.  He takes no blood thinning medication and he is never had a blood clot before.  He has no ripping or tearing symptoms and his abdominal pain does not go straight to his back.    Past Medical History:  Diagnosis Date  . Dementia (HCC)   . Diabetes mellitus without complication (HCC)   . GERD (gastroesophageal reflux disease)   . HOH (hard of hearing)    AID  . Hypertension     Patient Active Problem List   Diagnosis Date Noted  . AKI (acute kidney injury) (HCC) 04/15/2018  . Hypertensive urgency 06/20/2015  . Hypoglycemia associated with diabetes (HCC) 06/20/2015  . Anemia 06/20/2015  . Syncope 06/18/2015    Past Surgical History:  Procedure Laterality Date  . CATARACT EXTRACTION W/PHACO Left 08/29/2017   Procedure: CATARACT EXTRACTION PHACO AND INTRAOCULAR LENS PLACEMENT ([facility]);  Surgeon: Lockie MolaBrasington, Chadwick, MD;  Location: [facility] ORS;  Service: Ophthalmology;  Laterality: Left;  US 01:45.0 AP% 31.2 CDE 24.15 Fluid Pack Lot # [id number] H  . EYE SURGERY      Prior to Admission medications   Medication Sig Start Date End Date Taking? Authorizing Provider  amLODipine  (NORVASC) 5 MG tablet Take 5 mg by mouth daily. 09/29/16  Yes [provider]  aspirin EC 81 MG tablet Take 81 mg by mouth daily.   Yes [provider]  GLIPIZIDE XL 10 MG 24 hr tablet Take 10 mg by mouth daily. 06/29/17  Yes [provider]  losartan (COZAAR) 100 MG tablet Take 100 mg by mouth at bedtime.    Yes [provider]  omeprazole (PRILOSEC) 20 MG capsule Take 40 mg by mouth at bedtime.    Yes [provider]  pravastatin (PRAVACHOL) 20 MG tablet Take 20 mg by mouth at bedtime.    Yes [provider]    Allergies Patient has no known allergies.  History reviewed. No pertinent family history.  Social History Social History   Tobacco Use  . Smoking status: Former Games developermoker  . Smokeless tobacco: Never Used  Substance Use Topics  . Alcohol use: No  . Drug use: Never    Review of Systems Constitutional: No fever/chills Eyes: No visual changes. ENT: No sore throat. Cardiovascular: Denies chest pain. Respiratory: Positive for shortness of breath. Gastrointestinal: Positive for abdominal pain.  No nausea, no vomiting.  No diarrhea.  No constipation. Genitourinary: Negative for dysuria. Musculoskeletal: Negative for back pain. Skin: Negative for rash. Neurological: Negative for headaches, focal weakness or numbness.   ____________________________________________   PHYSICAL EXAM:  VITAL SIGNS: ED Triage Vitals  Enc Vitals Group     BP 04/14/18 2226 124/88  Pulse --      Resp --      Temp --      Temp src --      SpO2 --      Weight 04/14/18 2045 169 lb (76.7 kg)     Height 04/14/18 2045 5\' 9"  (1.753 m)     Head Circumference --      Peak Flow --      Pain Score 04/14/18 2045 0     Pain Loc --      Pain Edu? --      Excl. in GC? --     Constitutional: Pleasant cooperative increased respiratory effort but speaks in full sentences.  Obviously quite short of breath and hard of hearing Eyes: PERRL EOMI. Head:  Atraumatic. Nose: No congestion/rhinnorhea. Mouth/Throat: No trismus Neck: No stridor.  Able to lie completely flat with no jugular venous distention Cardiovascular: Normal rate, regular rhythm. Grossly normal heart sounds.  Good peripheral circulation. Respiratory: Increased respiratory effort although lungs are clear bilaterally Gastrointestinal: Soft nontender Musculoskeletal: No lower extremity edema legs are equal bilaterally Neurologic:  N No gross focal neurologic deficits are appreciated. Skin:  Skin is warm, dry and intact. No rash noted. Psychiatric: Mood and affect are normal. Speech and behavior are normal.    ____________________________________________   DIFFERENTIAL includes but not limited to  Aortic dissection, pulmonary embolism, pneumonia, influenza, cholelithiasis, pancreatitis, acute coronary syndrome ____________________________________________   LABS (all labs ordered are listed, but only abnormal results are displayed)  Labs Reviewed  TROPONIN I - Abnormal; Notable for the following components:      Result Value   Troponin I 0.03 (*)    All other components within normal limits  COMPREHENSIVE METABOLIC PANEL - Abnormal; Notable for the following components:   Glucose, Bld 156 (*)    Creatinine, Ser 1.30 (*)    GFR calc non Af Amer 45 (*)    GFR calc Af Amer 53 (*)    All other components within normal limits  TROPONIN I - Abnormal; Notable for the following components:   Troponin I 0.04 (*)    All other components within normal limits  APTT - Abnormal; Notable for the following components:   aPTT 38 (*)    All other components within normal limits  TSH - Abnormal; Notable for the following components:   TSH 11.294 (*)    All other components within normal limits  TROPONIN I - Abnormal; Notable for the following components:   Troponin I 0.03 (*)    All other components within normal limits  CBC  LIPASE, BLOOD  PROTIME-INR  GLUCOSE, CAPILLARY    TROPONIN I  TROPONIN I  CG4 I-STAT (LACTIC ACID)    Lab work reviewed by me shows elevated troponin which is rising likely secondary to strain __________________________________________  EKG  ED ECG REPORT I, Merrily Brittle, the attending physician, personally viewed and interpreted this ECG.  Date: 04/15/2018 EKG Time:  Rate: 93 Rhythm: normal sinus rhythm QRS Axis: normal Intervals: normal ST/T Wave abnormalities: normal Narrative Interpretation: no evidence of acute ischemia  ____________________________________________  RADIOLOGY  CT angiogram of the chest reviewed by me with bilateral acute pulmonary emboli with some right heart strain CT abdomen pelvis reviewed by me with cholelithiasis but no other acute disease noted ____________________________________________   PROCEDURES  Procedure(s) performed: no  .Critical Care Performed by: Merrily Brittle, MD Authorized by: Merrily Brittle, MD   Critical care provider statement:    Critical care time (  minutes):  30   Critical care time was exclusive of:  Separately billable procedures and treating other patients   Critical care was necessary to treat or prevent imminent or life-threatening deterioration of the following conditions:  Respiratory failure and circulatory failure   Critical care was time spent personally by me on the following activities:  Development of treatment plan with patient or surrogate, discussions with consultants, evaluation of patient's response to treatment, examination of patient, obtaining history from patient or surrogate, ordering and performing treatments and interventions, ordering and review of laboratory studies, ordering and review of radiographic studies, pulse oximetry, re-evaluation of patient's condition and review of old charts    Critical Care performed: Yes  ____________________________________________   INITIAL IMPRESSION / ASSESSMENT AND PLAN / ED COURSE  Pertinent  labs & imaging results that were available during my care of the patient were reviewed by me and considered in my medical decision making (see chart for details).   As part of my medical decision making, I reviewed the following data within the electronic MEDICAL RECORD NUMBER History obtained from family if available, nursing notes, old chart and ekg, as well as notes from prior ED visits.  The patient comes to the emergency department quite uncomfortable appearing and obviously short of breath.  Differential and a 84 year old with acute onset shortness of breath is very broad however I am most concerned with either aortic dissection versus pulmonary embolism.  As there seems to be more of a shortness of breath component and pain all CT with pulmonary embolism protocol.  He also has some upper abdominal discomfort and as he is already getting a contrast bolus I will add on an abdomen and pelvis to evaluate for intra-abdominal pathology as well.  He declines pain medication at this point.  The patient CT scan is back showing multiple bilateral pulmonary emboli with some component of right heart strain.  His troponin is slightly elevated but up trending.  I had a lengthy discussion with the patient and his daughter at bedside regarding his diagnosis including reviewing the CT scan and lab work personally with him.  I explained that the treatment for pulmonary embolism is anticoagulation however at 84 years old anticoagulation has its own host of risks.  At this point given his rising troponin and significant clot burden he requires inpatient admission for IV heparin infusion as if he were to begin bleeding heparin would be most easy to stop and reverse.  Patient and family verbalized understanding and agreement with the plan.  And discussed with the hospitalist Dr. Anne Hahn who has graciously agreed to admit the patient to his service.      ____________________________________________   FINAL CLINICAL  IMPRESSION(S) / ED DIAGNOSES  Final diagnoses:  Swelling      NEW MEDICATIONS STARTED DURING THIS VISIT:  Current Discharge Medication List       Note:  This document was prepared using Dragon voice recognition software and may include unintentional dictation errors.     Merrily Brittle, MD 04/15/18 [phone]

## 2018-04-15 NOTE — H&P (Signed)
Ruben Ruiz is an 84 y.o. male.   Chief Complaint: Shortness of breath HPI: Patient with past medical history of hypertension, diabetes and mention presents to the emergency department complaining of shortness of breath.  Is difficult to determine if the patient was initially complaining of abdominal pain or distention that caused pressure in his chest but it is clear that his chief complaint at this time is shortness of breath.  I gather that the patient feels as if his full respiratory excursion is limited by some abdominal discomfort.  He states that he has felt like this for more than a week.  The patient denies chest pain.  CTA of his chest in the emergency department revealed pulmonary embolism.  He was started on a heparin drip prior to the emergency department staff calling the hospitalist service for admission.  Past Medical History:  Diagnosis Date  . Dementia (HCC)   . Diabetes mellitus without complication (HCC)   . GERD (gastroesophageal reflux disease)   . HOH (hard of hearing)    AID  . Hypertension     Past Surgical History:  Procedure Laterality Date  . CATARACT EXTRACTION W/PHACO Left 08/29/2017   Procedure: CATARACT EXTRACTION PHACO AND INTRAOCULAR LENS PLACEMENT (IOC);  Surgeon: Lockie Mola, MD;  Location: [facility] ORS;  Service: Ophthalmology;  Laterality: Left;  [place] 01:45.0 AP% 31.2 CDE 24.15 Fluid Pack Lot # [id number]  . EYE SURGERY      History reviewed. No pertinent family history.  Caregiver left by the time of my interview  Social History:  reports that he has quit smoking. He has never used smokeless tobacco. He reports that he does not drink alcohol or use drugs.  Allergies: No Known Allergies  Medications Prior to Admission  Medication Sig Dispense Refill  . amLODipine (NORVASC) 5 MG tablet Take 5 mg by mouth daily.    Marland Kitchen aspirin EC 81 MG tablet Take 81 mg by mouth daily.    Marland Kitchen GLIPIZIDE XL 10 MG 24 hr tablet Take 10 mg by mouth daily.    Marland Kitchen losartan  (COZAAR) 100 MG tablet Take 100 mg by mouth at bedtime.     Marland Kitchen omeprazole (PRILOSEC) 20 MG capsule Take 40 mg by mouth at bedtime.     . pravastatin (PRAVACHOL) 20 MG tablet Take 20 mg by mouth at bedtime.       Results for orders placed or performed during the hospital encounter of 04/14/18 (from the past 48 hour(s))  CBC     Status: None   Collection Time: 04/14/18  8:55 PM  Result Value Ref Range   WBC 5.3 4.0 - 10.5 K/uL   RBC 5.03 4.22 - 5.81 MIL/uL   Hemoglobin 13.5 13.0 - 17.0 g/dL   HCT 45.4 09.8 - 11.9 %   MCV 85.1 80.0 - 100.0 fL   MCH 26.8 26.0 - 34.0 pg   MCHC 31.5 30.0 - 36.0 g/dL   RDW 14.7 82.9 - 56.2 %   Platelets 229 150 - 400 K/uL   nRBC 0.0 0.0 - 0.2 %    Comment: Performed at [facility], [address] [place], [place] [address]  Troponin I - ONCE - STAT     Status: Abnormal   Collection Time: 04/14/18  8:55 PM  Result Value Ref Range   Troponin I 0.03 (HH) <0.03 ng/mL    Comment: CRITICAL RESULT CALLED TO, READ BACK BY AND VERIFIED WITH LAURIE ALLEN @ 2147 04/14/2018 TTG Performed at [facility],  [address] [place], [place] [address]   Lipase, blood     Status: None   Collection Time: 04/14/18  8:55 PM  Result Value Ref Range   Lipase 25 11 - 51 U/L    Comment: Performed at [facility], [address] [place], [place] [address]  Comprehensive metabolic panel     Status: Abnormal   Collection Time: 04/14/18  8:55 PM  Result Value Ref Range   Sodium 135 135 - 145 mmol/L   Potassium 3.6 3.5 - 5.1 mmol/L   Chloride 103 98 - 111 mmol/L   CO2 25 22 - 32 mmol/L   Glucose, Bld 156 (H) 70 - 99 mg/dL   BUN 17 8 - 23 mg/dL   Creatinine, Ser 3.241.30 (H) 0.61 - 1.24 mg/dL   Calcium 8.9 8.9 - 40.110.3 mg/dL   Total Protein 7.1 6.5 - 8.1 g/dL   Albumin 3.7 3.5 - 5.0 g/dL   AST 22 15 - 41 U/L   ALT 14 0 - 44 U/L   Alkaline Phosphatase 74 38 - 126 U/L   Total Bilirubin 0.6 0.3 - 1.2 mg/dL   GFR calc non Af Amer 45 (L) >60 mL/min    GFR calc Af Amer 53 (L) >60 mL/min   Anion gap 7 5 - 15    Comment: Performed at [facility], [address] [place], [place] [address]  CG4 I-STAT (Lactic acid)     Status: None   Collection Time: 04/15/18 12:24 AM  Result Value Ref Range   Lactic Acid, Venous 1.32 0.5 - 1.9 mmol/L  Troponin I - Once     Status: Abnormal   Collection Time: 04/15/18 12:57 AM  Result Value Ref Range   Troponin I 0.04 (HH) <0.03 ng/mL    Comment: CRITICAL VALUE NOTED. VALUE IS CONSISTENT WITH PREVIOUSLY REPORTED/CALLED VALUE [facility] Performed at [facility], [address] [place], [place] [address]   Protime-INR     Status: None   Collection Time: 04/15/18 12:57 AM  Result Value Ref Range   Prothrombin Time 14.5 11.4 - 15.2 seconds   INR 1.14     Comment: Performed at [facility], [address] [place], [place] [address]  APTT     Status: Abnormal   Collection Time: 04/15/18 12:57 AM  Result Value Ref Range   aPTT 38 (H) 24 - 36 seconds    Comment:        IF BASELINE aPTT IS ELEVATED, SUGGEST PATIENT RISK ASSESSMENT BE USED TO DETERMINE APPROPRIATE ANTICOAGULANT THERAPY. Performed at [facility], [address] [place], [place] [address]    Dg Chest 2 View  Result Date: 04/14/2018 CLINICAL DATA:  Shortness of breath. Left abdominal pain. EXAM: CHEST - 2 VIEW COMPARISON:  Radiograph 10/11/2017, 12/10/2013 FINDINGS: Normal heart size and mediastinal contours. Atherosclerosis of the thoracic aorta. Interstitial coarsening as before. Patchy opacity at the right lung base. Minimal scarring in the right midlung. No pneumothorax, large pleural effusion, or pulmonary edema. Chronic compression deformity at the thoracolumbar junction. Scoliotic curvature of spine. IMPRESSION: 1. Patchy opacity at the right lung base, atelectasis versus pneumonia. Recommend correlation with clinical risk factors for aspiration given distribution. 2. Interstitial coarsening is  unchanged from prior exam and likely chronic in secondary to smoking history. Right midlung scarring again seen. 3.  Aortic Atherosclerosis (ICD10-I70.0). Electronically Signed   By: Narda RutherfordMelanie  Sanford M.D.   On: 04/14/2018 21:15   Ct Angio Chest Pe W/cm &/  or Wo Cm  Result Date: 04/15/2018 CLINICAL DATA:  Left upper abdominal pain.  Shortness of breath. EXAM: CT ANGIOGRAPHY CHEST CT ABDOMEN AND PELVIS WITH CONTRAST TECHNIQUE: Multidetector CT imaging of the chest was performed using the standard protocol during bolus administration of intravenous contrast. Multiplanar CT image reconstructions and MIPs were obtained to evaluate the vascular anatomy. Multidetector CT imaging of the abdomen and pelvis was performed using the standard protocol during bolus administration of intravenous contrast. CONTRAST:  ISOVUE-370 IOPAMIDOL (ISOVUE-370) INJECTION 76% COMPARISON:  Multiple prior chest x-rays, most recently from today. FINDINGS: CTA CHEST FINDINGS Cardiovascular: Acute segmental pulmonary emboli in the left lower lobe anterior and lateral basal segments, as well as the right middle lobe medial segment. Acute subsegmental pulmonary emboli in the lingula, left lower lobe posterior basal segment, right upper lobe apical segment, and right lower lobe lateral basal segment. Right heart dilatation with elevated RV and LV ratio measuring 1.6. Normal heart size. No pericardial effusion. Normal caliber thoracic aorta. Contrast timing is not optimized evaluate for aortic dissection. Coronary, aortic arch, and branch vessel atherosclerotic vascular disease. Mediastinum/Nodes: No enlarged mediastinal, hilar, or axillary lymph nodes. Thyroid gland, trachea, and esophagus demonstrate no significant findings. Lungs/Pleura: Moderate to severe centrilobular and paraseptal emphysema. Mild diffuse peribronchial thickening. No focal consolidation, pleural effusion, or pneumothorax. 5 mm nodule in the left lower lobe (series 6,  image 58). Musculoskeletal: No chest wall abnormality. No acute or significant osseous findings. Review of the MIP images confirms the above findings. CT ABDOMEN and PELVIS FINDINGS Hepatobiliary: No focal liver abnormality. Cholelithiasis. No gallbladder wall thickening or biliary dilatation. Pancreas: Mild atrophy. No ductal dilatation or surrounding inflammatory changes. Spleen: Normal in size without focal abnormality. Adrenals/Urinary Tract: Adrenal glands are unremarkable. Kidneys are normal, without renal calculi, focal lesion, or hydronephrosis. Bladder is unremarkable. Stomach/Bowel: Stomach is within normal limits. Appendix appears normal. No evidence of bowel wall thickening, distention, or inflammatory changes. Vascular/Lymphatic: Aortic atherosclerosis. No enlarged abdominal or pelvic lymph nodes. Reproductive: Mild prostatomegaly. Other: No abdominal wall hernia or abnormality. No abdominopelvic ascites. No pneumoperitoneum. Musculoskeletal: Chronic appearing L1 compression deformity. No acute or significant osseous findings. Review of the MIP images confirms the above findings. IMPRESSION: Chest: 1. Acute bilateral segmental and subsegmental pulmonary emboli. Evidence of right heart strain, however the patient does not meet criteria for code PE given the size of the emboli. 2. 5 mm left lower lobe pulmonary nodule. No follow-up needed if patient is low-risk. Non-contrast chest CT can be considered in 12 months if patient is high-risk. This recommendation follows the consensus statement: Guidelines for Management of Incidental Pulmonary Nodules Detected on CT Images: From the Fleischner Society 2017; Radiology 2017; 284:228-243. 3.  Emphysema (ICD10-J43.9). 4.  Aortic atherosclerosis (ICD10-I70.0). Abdomen and pelvis: 1.  No acute intra-abdominal process. 2. Cholelithiasis. 3. Chronic L1 compression deformity. Critical Value/emergent results were called by telephone at the time of interpretation on  04/15/2018 at 1:45 am to Dr. Merrily Brittle , who verbally acknowledged these results. Electronically Signed   By: Obie Dredge M.D.   On: 04/15/2018 01:49   Ct Abdomen Pelvis W Contrast  Result Date: 04/15/2018 CLINICAL DATA:  Left upper abdominal pain.  Shortness of breath. EXAM: CT ANGIOGRAPHY CHEST CT ABDOMEN AND PELVIS WITH CONTRAST TECHNIQUE: Multidetector CT imaging of the chest was performed using the standard protocol during bolus administration of intravenous contrast. Multiplanar CT image reconstructions and MIPs were obtained to evaluate the vascular anatomy. Multidetector CT imaging of the abdomen  and pelvis was performed using the standard protocol during bolus administration of intravenous contrast. CONTRAST:  ISOVUE-370 IOPAMIDOL (ISOVUE-370) INJECTION 76% COMPARISON:  Multiple prior chest x-rays, most recently from today. FINDINGS: CTA CHEST FINDINGS Cardiovascular: Acute segmental pulmonary emboli in the left lower lobe anterior and lateral basal segments, as well as the right middle lobe medial segment. Acute subsegmental pulmonary emboli in the lingula, left lower lobe posterior basal segment, right upper lobe apical segment, and right lower lobe lateral basal segment. Right heart dilatation with elevated RV and LV ratio measuring 1.6. Normal heart size. No pericardial effusion. Normal caliber thoracic aorta. Contrast timing is not optimized evaluate for aortic dissection. Coronary, aortic arch, and branch vessel atherosclerotic vascular disease. Mediastinum/Nodes: No enlarged mediastinal, hilar, or axillary lymph nodes. Thyroid gland, trachea, and esophagus demonstrate no significant findings. Lungs/Pleura: Moderate to severe centrilobular and paraseptal emphysema. Mild diffuse peribronchial thickening. No focal consolidation, pleural effusion, or pneumothorax. 5 mm nodule in the left lower lobe (series 6, image 58). Musculoskeletal: No chest wall abnormality. No acute or  significant osseous findings. Review of the MIP images confirms the above findings. CT ABDOMEN and PELVIS FINDINGS Hepatobiliary: No focal liver abnormality. Cholelithiasis. No gallbladder wall thickening or biliary dilatation. Pancreas: Mild atrophy. No ductal dilatation or surrounding inflammatory changes. Spleen: Normal in size without focal abnormality. Adrenals/Urinary Tract: Adrenal glands are unremarkable. Kidneys are normal, without renal calculi, focal lesion, or hydronephrosis. Bladder is unremarkable. Stomach/Bowel: Stomach is within normal limits. Appendix appears normal. No evidence of bowel wall thickening, distention, or inflammatory changes. Vascular/Lymphatic: Aortic atherosclerosis. No enlarged abdominal or pelvic lymph nodes. Reproductive: Mild prostatomegaly. Other: No abdominal wall hernia or abnormality. No abdominopelvic ascites. No pneumoperitoneum. Musculoskeletal: Chronic appearing L1 compression deformity. No acute or significant osseous findings. Review of the MIP images confirms the above findings. IMPRESSION: Chest: 1. Acute bilateral segmental and subsegmental pulmonary emboli. Evidence of right heart strain, however the patient does not meet criteria for code PE given the size of the emboli. 2. 5 mm left lower lobe pulmonary nodule. No follow-up needed if patient is low-risk. Non-contrast chest CT can be considered in 12 months if patient is high-risk. This recommendation follows the consensus statement: Guidelines for Management of Incidental Pulmonary Nodules Detected on CT Images: From the Fleischner Society 2017; Radiology 2017; 284:228-243. 3.  Emphysema (ICD10-J43.9). 4.  Aortic atherosclerosis (ICD10-I70.0). Abdomen and pelvis: 1.  No acute intra-abdominal process. 2. Cholelithiasis. 3. Chronic L1 compression deformity. Critical Value/emergent results were called by telephone at the time of interpretation on 04/15/2018 at 1:45 am to Dr. Merrily Brittle , who verbally  acknowledged these results. Electronically Signed   By: Obie Dredge M.D.   On: 04/15/2018 01:49    Review of Systems  Constitutional: Negative for chills and fever.  HENT: Negative for sore throat and tinnitus.   Eyes: Negative for blurred vision and redness.  Respiratory: Positive for shortness of breath. Negative for cough.   Cardiovascular: Negative for chest pain, palpitations, orthopnea and PND.  Gastrointestinal: Positive for abdominal pain. Negative for diarrhea, nausea and vomiting.  Genitourinary: Negative for dysuria, frequency and urgency.  Musculoskeletal: Negative for joint pain and myalgias.  Skin: Negative for rash.       No lesions  Neurological: Negative for speech change, focal weakness and weakness.  Endo/Heme/Allergies: Does not bruise/bleed easily.       No temperature intolerance  Psychiatric/Behavioral: Negative for depression and suicidal ideas.    Blood pressure (!) 151/95, pulse 88, temperature 98.3  F (36.8 C), temperature source Oral, resp. rate 18, height 5\' 9"  (1.753 m), weight 62 kg, SpO2 94 %. Physical Exam  Vitals reviewed. Constitutional: He is oriented to person, place, and time. He appears well-developed and well-nourished. No distress.  HENT:  Head: Normocephalic and atraumatic.  Mouth/Throat: Oropharynx is clear and moist.  Eyes: Pupils are equal, round, and reactive to light. Conjunctivae and EOM are normal. No scleral icterus.  Neck: Normal range of motion. Neck supple. No JVD present. No tracheal deviation present. No thyromegaly present.  Cardiovascular: Normal rate, regular rhythm and normal heart sounds. Exam reveals no gallop and no friction rub.  No murmur heard. Respiratory: Effort normal and breath sounds normal. No respiratory distress.  GI: Soft. Bowel sounds are normal. He exhibits no distension. There is no abdominal tenderness.  Genitourinary:    Genitourinary Comments: Deferred   Musculoskeletal: Normal range of motion.         General: No edema.  Lymphadenopathy:    He has no cervical adenopathy.  Neurological: He is alert and oriented to person, place, and time. No cranial nerve deficit.  Skin: Skin is warm and dry. No rash noted. No erythema.  Psychiatric: He has a normal mood and affect. His behavior is normal. Judgment and thought content normal.     Assessment/Plan This is a 84 year old male admitted for pulmonary embolism. 1.  PE: Currently on heparin drip.  Consult pharmacy for Eliquis dosing.  No hypoxia although the patient does have some right heart strain.  Obtain echocardiogram. 2.  Acute kidney injury: Hydrate with intravenous fluid.  Avoid nephrotoxic agents. 3.  Diabetes mellitus type 2: Hold oral hypoglycemic agents.  Sliding scale insulin while hospitalized 4.  Hypertension: Acceptable for age; continue amlodipine and losartan 5.  DVT prophylaxis: Therapeutic anticoagulation 6.  GI prophylaxis: Pantoprazole.  Consider swallow study if patient has difficulty with choking as has been reported per nursing staff. The patient is a full code.  Time spent on admission orders and patient care approximately 45 minutes  Arnaldo Natal, MD 04/15/2018, 5:09 AM

## 2018-04-15 NOTE — Progress Notes (Signed)
84 year old male who lives at home, independent at baseline was brought in secondary to shortness of breath and abdominal discomfort. -CT angiogram showing segmental and subsegmental bilateral pulmonary embolism and right heart strain -Married, vitals are stable -Echocardiogram is pending and carotid Dopplers -Was on heparin drip, changed to oral Eliquis today -PT consult today -If stable, anticipate discharge home in a.m.

## 2018-04-15 NOTE — Progress Notes (Signed)
*  PRELIMINARY RESULTS* Echocardiogram 2D Echocardiogram has been performed.  Ruben GulaJoan M Kahlee Ruiz 04/15/2018, 2:42 PM

## 2018-04-15 NOTE — Progress Notes (Signed)
PT Cancellation Note  Patient Details Name: Ruben Ruiz MRN: [id number] DOB: 01-25-1918   Cancelled Treatment:    Reason Eval/Treat Not Completed: Other (comment)(Pt admitted for SOB, CT found PE late PM 12/15. Per protocol PT will follow up when pt is more medically appropriate for exertional activity. )   [name] PT, DPT 8:17 AM,04/15/18 [phone]

## 2018-04-15 NOTE — Progress Notes (Signed)
Pt accidentally removed IV- per Dr. Nemiah CommanderKalisetti- pt is OK without IV- anticipated discharge tomorrow

## 2018-04-15 NOTE — Care Management Note (Signed)
Case Management Note  Patient Details  Name: Ruben Ruiz MRN: [id number] Date of Birth: 10/04/1917  Subjective/Objective:    Patient is from home with wife.  Admitted with SOB, PE.  Starting on Eliguis.  [facility] uses [facility] for [facility] and pharmacy.  30 free coupon of Eliquis given to patient.  PT evaluation pending.  He is declining any home health services.  Currently on room air.  Independent in all adl's.  No further needs identified at this time by CM team.  Wife will transport home at discharge.                  Action/Plan:   Expected Discharge Date:                  Expected Discharge Plan:  Home/Self Care  In-House Referral:     Discharge planning Services  CM Consult  Post Acute Care Choice:    Choice offered to:     DME Arranged:    DME Agency:     HH Arranged:    HH Agency:     Status of Service:  In process, will continue to follow  If discussed at Long Length of Stay Meetings, dates discussed:    Additional Comments:  Sherren KernsJennifer L Kerrilynn Derenzo, RN 04/15/2018, 11:42 AM

## 2018-04-15 NOTE — Progress Notes (Addendum)
ANTICOAGULATION CONSULT NOTE - Initial Consult  Pharmacy Consult for heparin drip Indication: pulmonary embolus  No Known Allergies  Patient Measurements: Height: 5\' 9"  (175.3 cm) Weight: 169 lb (76.7 kg) IBW/kg (Calculated) : 70.7 Heparin Dosing Weight: 77 kg  Vital Signs: BP: 127/96 (12/16 0000) Pulse Rate: 93 (12/16 0000)  Labs: Recent Labs    04/14/18 2055 04/15/18 0057  HGB 13.5  --   HCT 42.8  --   PLT 229  --   CREATININE 1.30*  --   TROPONINI 0.03* 0.04*    Estimated Creatinine Clearance: 31.7 mL/min (A) (by C-G formula based on SCr of 1.3 mg/dL (H)).   Medical History: Past Medical History:  Diagnosis Date  . Dementia (HCC)   . Diabetes mellitus without complication (HCC)   . GERD (gastroesophageal reflux disease)   . HOH (hard of hearing)    AID  . Hypertension     Medications:  No anticoag in PTA meds  Assessment: PE on CT  Goal of Therapy:  Heparin level 0.3-0.7 units/ml Monitor platelets by anticoagulation protocol: Yes   Plan:  4000 unit bolus and initial rate of 1300 units/hr. First heparin level 8 hours after start of infusion.  Changed to apixaban by admitting MD. 10 mg BID x 14 doses followed by 5 mg BID ordered.  Fulton ReekMatt Patryce Depriest, PharmD, BCPS  04/15/18 2:16 AM

## 2018-04-16 LAB — GLUCOSE, CAPILLARY
Glucose-Capillary: 77 mg/dL (ref 70–99)
Glucose-Capillary: 136 mg/dL — ABNORMAL HIGH (ref 70–99)

## 2018-04-16 LAB — ECHOCARDIOGRAM COMPLETE
Height: 69 in
Weight: 2187.2 oz

## 2018-04-16 MED ORDER — DOCUSATE SODIUM 100 MG PO CAPS: 100.0000 mg | ORAL_CAPSULE | Freq: Two times a day (BID) | ORAL | 0 refills | Status: DC

## 2018-04-16 MED ORDER — GLIPIZIDE 5 MG PO TABS: 5.0000 mg | ORAL_TABLET | Freq: Every day | ORAL | 11 refills | Status: AC

## 2018-04-16 MED ORDER — ELIQUIS 5 MG VTE STARTER PACK: ORAL_TABLET | ORAL | 1 refills | Status: DC

## 2018-04-16 MED ORDER — BISACODYL 5 MG PO TBEC
10.0000 mg | DELAYED_RELEASE_TABLET | Freq: Every day | ORAL | Status: DC
  Administered 2018-04-16: 10 mg via ORAL
  Filled 2018-04-16: qty 2

## 2018-04-16 MED ORDER — LACTULOSE 10 GM/15ML PO SOLN
20.0000 g | Freq: Every day | ORAL | Status: DC | PRN
  Administered 2018-04-16: 20 g via ORAL
  Filled 2018-04-16: qty 30

## 2018-04-16 MED ORDER — LEVOTHYROXINE SODIUM 50 MCG PO TABS: 50.0000 ug | ORAL_TABLET | Freq: Every day | ORAL | 1 refills | Status: AC

## 2018-04-16 NOTE — Evaluation (Signed)
Physical Therapy Evaluation Patient Details Name: Ruben Ruiz MRN: [id number] DOB: 13-Jun-1917 Today's Date: 04/16/2018   History of Present Illness  Pt is 84 yo male, presented to ED 04/14/18 for SOB. Workup revealed bilateral acute PE. PMH of HTN, DM, dementia. Prior to session, Dr. Nemiah Commander approved PT to evaluate patient.   Clinical Impression  Patient alert and agreeable to PT evaluation at start of session. Pt reported living with wife and grandchildren in two story house (lives on first floor), and stated family could be available 24/7 if needed. Prior to admission, pt reported being independent in gait, ADLs, IADLs, drives.   Patient HR and spO2 monitored throughout session. With HOB elevated, spO2 91-92%. The patient demonstrated bed mobility with mod I, sit <> stand with supervision. Patient ambulated ~232ft with no AD, close supervision/CGA. Occasional deviations in gait path noted as well as decreased gait velocity. SpO2 ranged from 87-91%, pt instructed in several standing rest breaks and deep breathing technique to improve spO2 levels. Pt with intermittent complaints of SOB, but no dizziness/lightheadedness, no LOB.  At end of mobility pt in chair, spO2 88%, about 2 minutes of rest needed for >92%. Nursing notified pt may benefit from formal oxygen need assessment prior to discharge. The patient would benefit from skilled PT intervention to address limitations in gait, mobility, balance, endurance, activity tolerance, as well as further education on energy conservation techniques. Recommendation is outpatient therapy, pt agreeable.     Follow Up Recommendations Outpatient PT    Equipment Recommendations  None recommended by PT    Recommendations for Other Services       Precautions / Restrictions Precautions Precautions: Fall Restrictions Weight Bearing Restrictions: No      Mobility  Bed Mobility Overal bed mobility: Modified Independent                 Transfers Overall transfer level: Needs assistance   Transfers: Sit to/from Stand Sit to Stand: Supervision            Ambulation/Gait Ambulation/Gait assistance: Supervision;Min guard Gait Distance (Feet): 200 Feet Assistive device: None       General Gait Details: Pt with mild deviations in gait path while ambulating, mild decreased gait velocity. Pt identified that his main limiting factor is catching his breath. SpO2 ranged from 88-91%, several standing rest breaks to improve spO2 needed.  Stairs            Wheelchair Mobility    Modified Rankin (Stroke Patients Only)       Balance Overall balance assessment: Mild deficits observed, not formally tested Sitting-balance support: Feet supported Sitting balance-Leahy Scale: Normal       Standing balance-Leahy Scale: Good                               Pertinent Vitals/Pain Pain Assessment: No/denies pain    Home Living Family/patient expects to be discharged to:: Private residence Living Arrangements: Spouse/significant other;Other relatives;Other (Comment)(grandchildren) Available Help at Discharge: Family Type of Home: House Home Access: Ramped entrance     Home Layout: Two level;Able to live on main level with bedroom/bathroom Home Equipment: None      Prior Function Level of Independence: Independent         Comments: Pt denies any falls in the last 6 months. Drives, grocery shops     Hand Dominance        Extremity/Trunk Assessment   Upper Extremity Assessment Upper  Extremity Assessment: Overall WFL for tasks assessed(grossly 4+/5 bilaterally)    Lower Extremity Assessment Lower Extremity Assessment: Overall WFL for tasks assessed(grossly 4+/5 bilaterally)       Communication   Communication: HOH  Cognition Arousal/Alertness: Awake/alert Behavior During Therapy: WFL for tasks assessed/performed Overall Cognitive Status: Within Functional Limits for tasks  assessed                                        General Comments      Exercises     Assessment/Plan    PT Assessment Patient needs continued PT services  PT Problem List Cardiopulmonary status limiting activity;Decreased activity tolerance;Decreased balance;Decreased mobility       PT Treatment Interventions DME instruction;Therapeutic exercise;Gait training;Balance training;Stair training;Neuromuscular re-education;Functional mobility training;Therapeutic activities;Patient/family education    PT Goals (Current goals can be found in the Care Plan section)  Acute Rehab PT Goals Patient Stated Goal: Patient eager to go home PT Goal Formulation: With patient Time For Goal Achievement: 04/30/18 Potential to Achieve Goals: Good    Frequency Min 2X/week   Barriers to discharge        Co-evaluation               AM-PAC PT "6 Clicks" Mobility  Outcome Measure Help needed turning from your back to your side while in a flat bed without using bedrails?: None Help needed moving from lying on your back to sitting on the side of a flat bed without using bedrails?: None Help needed moving to and from a bed to a chair (including a wheelchair)?: None Help needed standing up from a chair using your arms (e.g., wheelchair or bedside chair)?: None Help needed to walk in hospital room?: A Little Help needed climbing 3-5 steps with a railing? : A Little 6 Click Score: 22    End of Session Equipment Utilized During Treatment: Gait belt Activity Tolerance: Patient tolerated treatment well Patient left: with chair alarm set;in chair;with call bell/phone within reach Nurse Communication: Mobility status;Other (comment)(needs formal oxygen assessment with mobility) PT Visit Diagnosis: Unsteadiness on feet (R26.81);Other abnormalities of gait and mobility (R26.89)    Time: [id number] PT Time Calculation (min) (ACUTE ONLY): 23 min   Charges:   PT Evaluation $PT Eval  Moderate Complexity: 1 Mod PT Treatments $Therapeutic Activity: 8-22 mins        Olga Coasteriana Circe Chilton PT, DPT 11:18 AM,04/16/18 [phone]

## 2018-04-16 NOTE — Discharge Summary (Signed)
[facility] [place] [facility]   PATIENT NAME: Ruben Ruiz    MR#:  [id number]  DATE OF BIRTH:  03-05-1918  DATE OF ADMISSION:  04/14/2018   ADMITTING PHYSICIAN: Arnaldo Natal, MD  DATE OF DISCHARGE:  04/16/18  PRIMARY CARE PHYSICIAN: Hillery Aldo, MD   ADMISSION DIAGNOSIS:   Chest Pa  DISCHARGE DIAGNOSIS:   Active Problems:   AKI (acute kidney injury) (HCC)   SECONDARY DIAGNOSIS:   Past Medical History:  Diagnosis Date  . Dementia (HCC)   . Diabetes mellitus without complication (HCC)   . GERD (gastroesophageal reflux disease)   . HOH (hard of hearing)    AID  . Hypertension     HOSPITAL COURSE:   84 year old male who lives at home, independent at baseline, past medical history significant for diabetes, GERD, hypertension presents to hospital secondary to shortness of breath and abdominal discomfort.  1.  Bilateral pulmonary embolism-CT Angiogram on admission done for dyspnea and hypoxia showing segmental and subsegmental bilateral pulmonary embolism and minimal right heart strain -Patient on room air, breathing is much improved.  He was started on heparin drip and now transition to oral Eliquis which she will be taking at discharge. -Dopplers of lower extremities negative for any DVT -Echo with EF down to 40 to 45% but no right heart strain.  2.  Hypertension-continue Norvasc and losartan  3.  Diabetes mellitus-glipizide dose has been reduced to 5 mg based on the sugars here in the hospital.  4.  Hypothyroidism-TSH has been elevated and free T4 is low.  Started on Synthroid.  5.  Hyperlipidemia-statin  Patient is independent.  Worked with physical therapy and they have recommended outpatient PT  DISCHARGE CONDITIONS:   Guarded  CONSULTS OBTAINED:   None  DRUG ALLERGIES:   No Known Allergies DISCHARGE MEDICATIONS:   Allergies as of 04/16/2018   No Known Allergies     Medication List    STOP taking these  medications   GLIPIZIDE XL 10 MG 24 hr tablet Generic drug:  glipiZIDE Replaced by:  glipiZIDE 5 MG tablet     TAKE these medications   amLODipine 5 MG tablet Commonly known as:  NORVASC Take 5 mg by mouth daily.   aspirin EC 81 MG tablet Take 81 mg by mouth daily.   docusate sodium 100 MG capsule Commonly known as:  COLACE Take 1 capsule (100 mg total) by mouth 2 (two) times daily.   ELIQUIS STARTER PACK 5 MG Tabs Take as directed on package: start with two-5mg  tablets twice daily for 7 days. On day 8, switch to one-5mg  tablet twice daily.   glipiZIDE 5 MG tablet Commonly known as:  GLUCOTROL Take 1 tablet (5 mg total) by mouth daily. Replaces:  GLIPIZIDE XL 10 MG 24 hr tablet   levothyroxine 50 MCG tablet Commonly known as:  SYNTHROID, LEVOTHROID Take 1 tablet (50 mcg total) by mouth daily before breakfast. Start taking on:  April 17, 2018   losartan 100 MG tablet Commonly known as:  COZAAR Take 100 mg by mouth at bedtime.   omeprazole 20 MG capsule Commonly known as:  PRILOSEC Take 40 mg by mouth at bedtime.   pravastatin 20 MG tablet Commonly known as:  PRAVACHOL Take 20 mg by mouth at bedtime.        DISCHARGE INSTRUCTIONS:   1.  PCP follow-up in 1 to 2 weeks  DIET:   Cardiac diet  ACTIVITY:   Activity as tolerated  OXYGEN:   Home Oxygen: No.  Oxygen Delivery: room air  DISCHARGE LOCATION:   home   If you experience worsening of your admission symptoms, develop shortness of breath, life threatening emergency, suicidal or homicidal thoughts you must seek medical attention immediately by calling 911 or calling your MD immediately  if symptoms less severe.  You Must read complete instructions/literature along with all the possible adverse reactions/side effects for all the Medicines you take and that have been prescribed to you. Take any new Medicines after you have completely understood and accpet all the possible adverse reactions/side  effects.   Please note  You were cared for by a hospitalist during your hospital stay. If you have any questions about your discharge medications or the care you received while you were in the hospital after you are discharged, you can call the unit and asked to speak with the hospitalist on call if the hospitalist that took care of you is not available. Once you are discharged, your primary care physician will handle any further medical issues. Please note that NO REFILLS for any discharge medications will be authorized once you are discharged, as it is imperative that you return to your primary care physician (or establish a relationship with a primary care physician if you do not have one) for your aftercare needs so that they can reassess your need for medications and monitor your lab values.    On the day of Discharge:  VITAL SIGNS:   Blood pressure 117/82, pulse 83, temperature 97.7 F (36.5 C), temperature source Oral, resp. rate 14, height 5\' 9"  (1.753 m), weight 62.3 kg, SpO2 95 %.  PHYSICAL EXAMINATION:    GENERAL:  84 y.o.-year-old elderly patient lying in the bed with no acute distress.  Very hard of hearing EYES: Pupils equal, round, reactive to light and accommodation. No scleral icterus. Extraocular muscles intact.  HEENT: Head atraumatic, normocephalic. Oropharynx and nasopharynx clear.  NECK:  Supple, no jugular venous distention. No thyroid enlargement, no tenderness.  LUNGS: Normal breath sounds bilaterally, no wheezing, rales,rhonchi or crepitation. No use of accessory muscles of respiration.  Decreased bibasilar breath sounds. CARDIOVASCULAR: S1, S2 normal. No rubs, or gallops.  3/6 systolic murmur is present ABDOMEN: Soft, non-tender, non-distended. Bowel sounds present. No organomegaly or mass.  EXTREMITIES: No pedal edema, cyanosis, or clubbing.  NEUROLOGIC: Cranial nerves II through XII are intact. Muscle strength 5/5 in all extremities. Sensation intact. Gait not  checked.  PSYCHIATRIC: The patient is alert and oriented x 3.  SKIN: No obvious rash, lesion, or ulcer.   DATA REVIEW:   CBC Recent Labs  Lab 04/14/18 2055  WBC 5.3  HGB 13.5  HCT 42.8  PLT 229    Chemistries  Recent Labs  Lab 04/14/18 2055  NA 135  K 3.6  CL 103  CO2 25  GLUCOSE 156*  BUN 17  CREATININE 1.30*  CALCIUM 8.9  AST 22  ALT 14  ALKPHOS 74  BILITOT 0.6     Microbiology Results  No results found for this or any previous visit.  RADIOLOGY:  No results found.   Management plans discussed with the patient, family and they are in agreement.  CODE STATUS:     Code Status Orders  (From admission, onward)         Start     Ordered   04/15/18 0426  Full code  Continuous     04/15/18 0425        Code Status History  Date Active Date Inactive Code Status Order ID Comments User Context   06/18/2015 1736 06/20/2015 1716 Full Code [id number]  Enid Baas, MD Inpatient      TOTAL TIME TAKING CARE OF THIS PATIENT: 38 minutes.    Enid Baas M.D on 04/16/2018 at 1:00 PM  Between 7am to 6pm - Pager - [phone]  After 6pm go to [email or web address] - Social research officer, government  [facility] [place] [facility]  Office  [phone]  CC: Primary care physician; Hillery Aldo, MD   Note: This dictation was prepared with Dragon dictation along with smaller phrase technology. Any transcriptional errors that result from this process are unintentional.

## 2018-04-16 NOTE — Care Management Note (Signed)
Case Management Note  Patient Details  Name: Ruben Ruiz MRN: [id number] Date of Birth: 07-Jun-1917  Subjective/Objective:     Patient has discharge order.  PT has recommended out patient rehab.  Ruben Ruiz [facility].  Order signed and faxed.  Explained to patient they will call him with an appointment.                 Action/Plan:   Expected Discharge Date:  04/16/18               Expected Discharge Plan:  OP Rehab  In-House Referral:     Discharge planning Services  CM Consult  Post Acute Care Choice:    Choice offered to:     DME Arranged:    DME Agency:     HH Arranged:    HH Agency:     Status of Service:  Completed, signed off  If discussed at [employer] Length of Stay Meetings, dates discussed:    Additional Comments:  Sherren KernsJennifer L Tomi Grandpre, RN 04/16/2018, 12:59 PM

## 2018-04-16 NOTE — Plan of Care (Signed)
  Problem: Education: Goal: Knowledge of disease and its progression will improve Outcome: Progressing   Problem: Health Behavior/Discharge Planning: Goal: Ability to manage health-related needs will improve Outcome: Progressing   Problem: Clinical Measurements: Goal: Complications related to the disease process or treatment will be avoided or minimized Outcome: Progressing   Problem: Activity: Goal: Activity intolerance will improve Outcome: Progressing   

## 2018-04-16 NOTE — Progress Notes (Signed)
Attempted to call patient wife and notify her of patient discharge status, no answer w/ no opportunity for leaving voicemail. Will try again.

## 2018-04-16 NOTE — Plan of Care (Signed)
Patient is progressing towards discharge without any events.

## 2018-07-02 ENCOUNTER — Inpatient Hospital Stay
Admission: EM | Admit: 2018-07-02 | Discharge: 2018-07-03 | DRG: 948 | Disposition: A | Discharge status: Home / Self Care | Payer: Medicare Other | Attending: Internal Medicine | Admitting: Internal Medicine

## 2018-07-02 ENCOUNTER — Inpatient Hospital Stay: Payer: Medicare Other

## 2018-07-02 ENCOUNTER — Emergency Department: Payer: Medicare Other

## 2018-07-02 ENCOUNTER — Inpatient Hospital Stay
Admit: 2018-07-02 | Discharge: 2018-07-02 | Disposition: A | Discharge status: Home / Self Care | Payer: Medicare Other | Attending: Internal Medicine | Admitting: Internal Medicine

## 2018-07-02 ENCOUNTER — Other Ambulatory Visit: Payer: Self-pay

## 2018-07-02 ENCOUNTER — Encounter: Payer: Self-pay | Admitting: Emergency Medicine

## 2018-07-02 DIAGNOSIS — Z66 Do not resuscitate: Secondary | ICD-10-CM | POA: Diagnosis present

## 2018-07-02 DIAGNOSIS — Z7901 Long term (current) use of anticoagulants: Secondary | ICD-10-CM

## 2018-07-02 DIAGNOSIS — F0390 Unspecified dementia without behavioral disturbance: Secondary | ICD-10-CM | POA: Diagnosis present

## 2018-07-02 DIAGNOSIS — Z7984 Long term (current) use of oral hypoglycemic drugs: Secondary | ICD-10-CM

## 2018-07-02 DIAGNOSIS — H9190 Unspecified hearing loss, unspecified ear: Secondary | ICD-10-CM | POA: Diagnosis present

## 2018-07-02 DIAGNOSIS — E1151 Type 2 diabetes mellitus with diabetic peripheral angiopathy without gangrene: Secondary | ICD-10-CM | POA: Diagnosis present

## 2018-07-02 DIAGNOSIS — Z86718 Personal history of other venous thrombosis and embolism: Secondary | ICD-10-CM | POA: Diagnosis not present

## 2018-07-02 DIAGNOSIS — I10 Essential (primary) hypertension: Secondary | ICD-10-CM | POA: Diagnosis present

## 2018-07-02 DIAGNOSIS — R05 Cough: Secondary | ICD-10-CM

## 2018-07-02 DIAGNOSIS — Z79890 Hormone replacement therapy (postmenopausal): Secondary | ICD-10-CM

## 2018-07-02 DIAGNOSIS — Z87891 Personal history of nicotine dependence: Secondary | ICD-10-CM

## 2018-07-02 DIAGNOSIS — K219 Gastro-esophageal reflux disease without esophagitis: Secondary | ICD-10-CM | POA: Diagnosis present

## 2018-07-02 DIAGNOSIS — R059 Cough, unspecified: Secondary | ICD-10-CM

## 2018-07-02 DIAGNOSIS — R4182 Altered mental status, unspecified: Secondary | ICD-10-CM | POA: Diagnosis present

## 2018-07-02 DIAGNOSIS — Z79899 Other long term (current) drug therapy: Secondary | ICD-10-CM | POA: Diagnosis not present

## 2018-07-02 DIAGNOSIS — R7989 Other specified abnormal findings of blood chemistry: Principal | ICD-10-CM | POA: Diagnosis present

## 2018-07-02 DIAGNOSIS — Z7982 Long term (current) use of aspirin: Secondary | ICD-10-CM | POA: Diagnosis not present

## 2018-07-02 DIAGNOSIS — I639 Cerebral infarction, unspecified: Secondary | ICD-10-CM

## 2018-07-02 DIAGNOSIS — Z86711 Personal history of pulmonary embolism: Secondary | ICD-10-CM | POA: Diagnosis not present

## 2018-07-02 DIAGNOSIS — R778 Other specified abnormalities of plasma proteins: Secondary | ICD-10-CM | POA: Diagnosis present

## 2018-07-02 LAB — CBC WITH DIFFERENTIAL/PLATELET
Abs Immature Granulocytes: 0.02 10*3/uL (ref 0.00–0.07)
Basophils Absolute: 0.1 10*3/uL (ref 0.0–0.1)
Basophils Relative: 1 %
EOS ABS: 0.3 10*3/uL (ref 0.0–0.5)
Eosinophils Relative: 3 %
HCT: 43.9 % (ref 39.0–52.0)
Hemoglobin: 13.7 g/dL (ref 13.0–17.0)
Immature Granulocytes: 0 %
Lymphocytes Relative: 15 %
Lymphs Abs: 1.1 10*3/uL (ref 0.7–4.0)
MCH: 26.3 pg (ref 26.0–34.0)
MCHC: 31.2 g/dL (ref 30.0–36.0)
MCV: 84.3 fL (ref 80.0–100.0)
Monocytes Absolute: 0.8 10*3/uL (ref 0.1–1.0)
Monocytes Relative: 11 %
Neutro Abs: 5.1 10*3/uL (ref 1.7–7.7)
Neutrophils Relative %: 70 %
Platelets: 375 10*3/uL (ref 150–400)
RBC: 5.21 MIL/uL (ref 4.22–5.81)
RDW: 13.5 % (ref 11.5–15.5)
WBC: 7.4 10*3/uL (ref 4.0–10.5)
nRBC: 0 % (ref 0.0–0.2)

## 2018-07-02 LAB — URINALYSIS, COMPLETE (UACMP) WITH MICROSCOPIC
Bacteria, UA: NONE SEEN
Bilirubin Urine: NEGATIVE
Glucose, UA: 50 mg/dL — AB
Ketones, ur: NEGATIVE mg/dL
Leukocytes,Ua: NEGATIVE
Nitrite: NEGATIVE
Protein, ur: NEGATIVE mg/dL
Specific Gravity, Urine: 1.023 (ref 1.005–1.030)
pH: 5 (ref 5.0–8.0)

## 2018-07-02 LAB — PROTIME-INR
INR: 1.2 (ref 0.8–1.2)
PROTHROMBIN TIME: 14.9 s (ref 11.4–15.2)

## 2018-07-02 LAB — LIPID PANEL
Cholesterol: 156 mg/dL (ref 0–200)
HDL: 56 mg/dL (ref >40)
LDL Cholesterol: 94 mg/dL (ref 0–99)
TRIGLYCERIDES: 29 mg/dL (ref <150)
Total CHOL/HDL Ratio: 2.8 RATIO
VLDL: 6 mg/dL (ref 0–40)

## 2018-07-02 LAB — COMPREHENSIVE METABOLIC PANEL
ALT: 12 U/L (ref 0–44)
AST: 22 U/L (ref 15–41)
Albumin: 3.5 g/dL (ref 3.5–5.0)
Alkaline Phosphatase: 73 U/L (ref 38–126)
Anion gap: 12 (ref 5–15)
BUN: 21 mg/dL (ref 8–23)
CO2: 23 mmol/L (ref 22–32)
Calcium: 8.9 mg/dL (ref 8.9–10.3)
Chloride: 103 mmol/L (ref 98–111)
Creatinine, Ser: 1.31 mg/dL — ABNORMAL HIGH (ref 0.61–1.24)
GFR calc non Af Amer: 45 mL/min — ABNORMAL LOW (ref >60)
GFR, EST AFRICAN AMERICAN: 52 mL/min — AB (ref >60)
Glucose, Bld: 191 mg/dL — ABNORMAL HIGH (ref 70–99)
POTASSIUM: 3.9 mmol/L (ref 3.5–5.1)
Sodium: 138 mmol/L (ref 135–145)
TOTAL PROTEIN: 7.4 g/dL (ref 6.5–8.1)
Total Bilirubin: 1.2 mg/dL (ref 0.3–1.2)

## 2018-07-02 LAB — TROPONIN I
Troponin I: 0.87 ng/mL (ref <0.03)
Troponin I: 1.01 ng/mL (ref <0.03)
Troponin I: 0.63 ng/mL (ref <0.03)
Troponin I: 0.87 ng/mL (ref <0.03)

## 2018-07-02 LAB — HEMOGLOBIN A1C
Hgb A1c MFr Bld: 7.4 % — ABNORMAL HIGH (ref 4.8–5.6)
Mean Plasma Glucose: 165.68 mg/dL

## 2018-07-02 LAB — HEPARIN LEVEL (UNFRACTIONATED): Heparin Unfractionated: 0.58 [IU]/mL (ref 0.30–0.70)

## 2018-07-02 LAB — GLUCOSE, CAPILLARY
Glucose-Capillary: 124 mg/dL — ABNORMAL HIGH (ref 70–99)
Glucose-Capillary: 82 mg/dL (ref 70–99)

## 2018-07-02 LAB — APTT
aPTT: 35 seconds (ref 24–36)
aPTT: 97 s — ABNORMAL HIGH (ref 24–36)

## 2018-07-02 LAB — ETHANOL: Alcohol, Ethyl (B): <10 mg/dL (ref <10)

## 2018-07-02 MED ORDER — DOCUSATE SODIUM 100 MG PO CAPS
100.0000 mg | ORAL_CAPSULE | Freq: Two times a day (BID) | ORAL | Status: DC
  Administered 2018-07-02 – 2018-07-03 (×3): 100 mg via ORAL
  Filled 2018-07-02 (×3): qty 1

## 2018-07-02 MED ORDER — HEPARIN BOLUS VIA INFUSION
4000.0000 [IU] | Freq: Once | INTRAVENOUS | Status: AC
  Administered 2018-07-02: 4000 [IU] via INTRAVENOUS
  Filled 2018-07-02: qty 4000

## 2018-07-02 MED ORDER — GLIPIZIDE 5 MG PO TABS
5.0000 mg | ORAL_TABLET | Freq: Every day | ORAL | Status: DC
  Administered 2018-07-02: 5 mg via ORAL
  Filled 2018-07-02 (×3): qty 1

## 2018-07-02 MED ORDER — INSULIN ASPART 100 UNIT/ML ~~LOC~~ SOLN: 0.0000 [IU] | Freq: Every day | SUBCUTANEOUS | Status: DC

## 2018-07-02 MED ORDER — HEPARIN (PORCINE) 25000 UT/250ML-% IV SOLN
800.0000 [IU]/h | INTRAVENOUS | Status: DC
  Administered 2018-07-02: 800 [IU]/h via INTRAVENOUS
  Filled 2018-07-02 (×2): qty 250

## 2018-07-02 MED ORDER — INSULIN ASPART 100 UNIT/ML ~~LOC~~ SOLN
0.0000 [IU] | Freq: Three times a day (TID) | SUBCUTANEOUS | Status: DC
  Administered 2018-07-02: 1 [IU] via SUBCUTANEOUS
  Administered 2018-07-03: 2 [IU] via SUBCUTANEOUS
  Administered 2018-07-03: 1 [IU] via SUBCUTANEOUS
  Filled 2018-07-02 (×3): qty 1

## 2018-07-02 MED ORDER — PRAVASTATIN SODIUM 20 MG PO TABS
20.0000 mg | ORAL_TABLET | Freq: Every day | ORAL | Status: DC
  Administered 2018-07-02: 20 mg via ORAL
  Filled 2018-07-02 (×3): qty 1

## 2018-07-02 MED ORDER — PANTOPRAZOLE SODIUM 40 MG PO TBEC
40.0000 mg | DELAYED_RELEASE_TABLET | Freq: Every day | ORAL | Status: DC
  Administered 2018-07-02 – 2018-07-03 (×2): 40 mg via ORAL
  Filled 2018-07-02 (×2): qty 1

## 2018-07-02 MED ORDER — SODIUM CHLORIDE 0.9 % IV BOLUS
500.0000 mL | Freq: Once | INTRAVENOUS | Status: AC
  Administered 2018-07-02: 500 mL via INTRAVENOUS

## 2018-07-02 MED ORDER — DOCUSATE SODIUM 100 MG PO CAPS: 100.0000 mg | ORAL_CAPSULE | Freq: Two times a day (BID) | ORAL | Status: DC | PRN

## 2018-07-02 MED ORDER — IOHEXOL 350 MG/ML SOLN
75.0000 mL | Freq: Once | INTRAVENOUS | Status: AC | PRN
  Administered 2018-07-02: 75 mL via INTRAVENOUS

## 2018-07-02 MED ORDER — LOSARTAN POTASSIUM 50 MG PO TABS
100.0000 mg | ORAL_TABLET | Freq: Every day | ORAL | Status: DC
  Administered 2018-07-02: 100 mg via ORAL
  Filled 2018-07-02 (×2): qty 2

## 2018-07-02 MED ORDER — ASPIRIN EC 81 MG PO TBEC
81.0000 mg | DELAYED_RELEASE_TABLET | Freq: Every day | ORAL | Status: DC
  Administered 2018-07-02 – 2018-07-03 (×2): 81 mg via ORAL
  Filled 2018-07-02 (×3): qty 1

## 2018-07-02 MED ORDER — AMLODIPINE BESYLATE 5 MG PO TABS
5.0000 mg | ORAL_TABLET | Freq: Every day | ORAL | Status: DC
  Administered 2018-07-02 – 2018-07-03 (×2): 5 mg via ORAL
  Filled 2018-07-02 (×2): qty 1

## 2018-07-02 MED ORDER — LEVOTHYROXINE SODIUM 50 MCG PO TABS
50.0000 ug | ORAL_TABLET | Freq: Every day | ORAL | Status: DC
  Administered 2018-07-03: 50 ug via ORAL
  Filled 2018-07-02: qty 1

## 2018-07-02 MED ORDER — HEPARIN SODIUM (PORCINE) 5000 UNIT/ML IJ SOLN
5000.0000 [IU] | Freq: Three times a day (TID) | INTRAMUSCULAR | Status: DC
  Filled 2018-07-02: qty 1

## 2018-07-02 NOTE — ED Notes (Signed)
Pt in MRI.

## 2018-07-02 NOTE — Progress Notes (Signed)
*  PRELIMINARY RESULTS* Echocardiogram 2D Echocardiogram has been performed.  Cristela Blue 07/02/2018, 1:10 PM

## 2018-07-02 NOTE — ED Notes (Signed)
fsbs 124

## 2018-07-02 NOTE — ED Notes (Signed)
Pt ate two meal tray.

## 2018-07-02 NOTE — ED Notes (Signed)
Given meal tray.

## 2018-07-02 NOTE — ED Notes (Signed)
ED Provider at bedside. 

## 2018-07-02 NOTE — ED Notes (Signed)
PA student at  Bedside.

## 2018-07-02 NOTE — ED Triage Notes (Signed)
Patient presents to ED via POV from home with family due to "balance issues". Family report patient has had frequent falls. Patient denies but reports he feels "off balance" and has for 2 weeks now.

## 2018-07-02 NOTE — ED Notes (Signed)
Pt eating   meds given  

## 2018-07-02 NOTE — Progress Notes (Signed)
Family Meeting Note  Advance Directive:no  Today a meeting took place with the Patient and spouse.   The following clinical team members were present during this meeting:MD  The following were discussed:Patient's diagnosis: Elevated troponin, Hypertension, DM, Patient's progosis: Unable to determine and Goals for treatment: DNR  Additional follow-up to be provided: Cardiology  Time spent during discussion:20 minutes  Altamese Dilling, MD

## 2018-07-02 NOTE — ED Notes (Signed)
Pt ambulates safely down the hallway independently.

## 2018-07-02 NOTE — ED Notes (Signed)
ED TO INPATIENT HANDOFF REPORT  ED Nurse Name and Phone #: ally [phone]  S Name/Age/Gender Ruben Ruiz 85 y.o. male Room/Bed: ED01A/ED01A  Code Status   Code Status: DNR  Home/SNF/Other Home Patient oriented to: self and place Is this baseline? Yes   Triage Complete: Triage complete  Chief Complaint unsteady gait  Triage Note Patient presents to ED via POV from home with family due to "balance issues". Family report patient has had frequent falls. Patient denies but reports he feels "off balance" and has for 2 weeks now.    Allergies No Known Allergies  Level of Care/Admitting Diagnosis ED Disposition    ED Disposition Condition Comment   Admit  Hospital Area: [facility] [[id number]]  Level of Care: Telemetry [5]  Diagnosis: Elevated troponin [321909]  Admitting Physician: Altamese Dilling [[id number]]  Attending Physician: Altamese Dilling [phone]  Estimated length of stay: past midnight tomorrow  Certification:: I certify this patient will need inpatient services for at least 2 midnights  PT Class (Do Not Modify): Inpatient [101]  PT Acc Code (Do Not Modify): Private [1]       B Medical/Surgery History Past Medical History:  Diagnosis Date  . Dementia (HCC)   . Diabetes mellitus without complication (HCC)   . GERD (gastroesophageal reflux disease)   . HOH (hard of hearing)    AID  . Hypertension    Past Surgical History:  Procedure Laterality Date  . CATARACT EXTRACTION W/PHACO Left 08/29/2017   Procedure: CATARACT EXTRACTION PHACO AND INTRAOCULAR LENS PLACEMENT (IOC);  Surgeon: Lockie Mola, MD;  Location: [facility] ORS;  Service: Ophthalmology;  Laterality: Left;  [place] 01:45.0 AP% 31.2 CDE 24.15 Fluid Pack Lot # [id number]  . EYE SURGERY       A IV Location/Drains/Wounds Patient Lines/Drains/Airways Status   Active Line/Drains/Airways    Name:   Placement date:   Placement time:   Site:   Days:   Peripheral IV  07/02/18 Right Forearm   07/02/18    0805    Forearm   less than 1   Peripheral IV 07/02/18 Right Forearm   07/02/18    1432    Forearm   less than 1   Incision (Closed) 07/12/17 Eye Left   07/12/17    0704     355   Incision (Closed) 08/29/17 Eye Left   08/29/17    0654     307          Intake/Output Last 24 hours No intake or output data in the 24 hours ending 07/02/18 1455  Labs/Imaging Results for orders placed or performed during the hospital encounter of 07/02/18 (from the past 48 hour(s))  CBC with Differential     Status: None   Collection Time: 07/02/18  7:54 AM  Result Value Ref Range   WBC 7.4 4.0 - 10.5 K/uL   RBC 5.21 4.22 - 5.81 MIL/uL   Hemoglobin 13.7 13.0 - 17.0 g/dL   HCT 40.3 47.4 - 25.9 %   MCV 84.3 80.0 - 100.0 fL   MCH 26.3 26.0 - 34.0 pg   MCHC 31.2 30.0 - 36.0 g/dL   RDW 56.3 87.5 - 64.3 %   Platelets 375 150 - 400 K/uL   nRBC 0.0 0.0 - 0.2 %   Neutrophils Relative % 70 %   Neutro Abs 5.1 1.7 - 7.7 K/uL   Lymphocytes Relative 15 %   Lymphs Abs 1.1 0.7 - 4.0 K/uL   Monocytes Relative 11 %  Monocytes Absolute 0.8 0.1 - 1.0 K/uL   Eosinophils Relative 3 %   Eosinophils Absolute 0.3 0.0 - 0.5 K/uL   Basophils Relative 1 %   Basophils Absolute 0.1 0.0 - 0.1 K/uL   Immature Granulocytes 0 %   Abs Immature Granulocytes 0.02 0.00 - 0.07 K/uL    Comment: Performed at [facility], [address] [place], [place] [address]  Protime-INR     Status: None   Collection Time: 07/02/18  7:54 AM  Result Value Ref Range   Prothrombin Time 14.9 11.4 - 15.2 seconds   INR 1.2 0.8 - 1.2    Comment: (NOTE) INR goal varies based on device and disease states. Performed at [facility], [address] [place], [place] [address]   Comprehensive metabolic panel     Status: Abnormal   Collection Time: 07/02/18  7:54 AM  Result Value Ref Range   Sodium 138 135 - 145 mmol/L   Potassium 3.9 3.5 - 5.1 mmol/L   Chloride 103 98 - 111 mmol/L   CO2 23  22 - 32 mmol/L   Glucose, Bld 191 (H) 70 - 99 mg/dL   BUN 21 8 - 23 mg/dL   Creatinine, Ser 9.53 (H) 0.61 - 1.24 mg/dL   Calcium 8.9 8.9 - 20.2 mg/dL   Total Protein 7.4 6.5 - 8.1 g/dL   Albumin 3.5 3.5 - 5.0 g/dL   AST 22 15 - 41 U/L   ALT 12 0 - 44 U/L   Alkaline Phosphatase 73 38 - 126 U/L   Total Bilirubin 1.2 0.3 - 1.2 mg/dL   GFR calc non Af Amer 45 (L) >60 mL/min   GFR calc Af Amer 52 (L) >60 mL/min   Anion gap 12 5 - 15    Comment: Performed at [facility], [address] [place], [place] [address]  APTT     Status: None   Collection Time: 07/02/18  7:54 AM  Result Value Ref Range   aPTT 35 24 - 36 seconds    Comment: Performed at [facility], [address] [place], [place] [address]  Ethanol     Status: None   Collection Time: 07/02/18  7:54 AM  Result Value Ref Range   Alcohol, Ethyl (B) <10 <10 mg/dL    Comment: (NOTE) Lowest detectable limit for serum alcohol is 10 mg/dL. For medical purposes only. Performed at [facility], [address] [place], [place] [address]   Troponin I - Once     Status: Abnormal   Collection Time: 07/02/18  7:54 AM  Result Value Ref Range   Troponin I 0.87 (HH) <0.03 ng/mL    Comment: CRITICAL RESULT CALLED TO, READ BACK BY AND VERIFIED WITH ALLY Timothee Gali@[email or web address]  ON 07/02/18 BY HKP Performed at [facility], [address] [place], [place] [address]   Urinalysis, Complete w Microscopic     Status: Abnormal   Collection Time: 07/02/18  7:54 AM  Result Value Ref Range   Color, Urine YELLOW (A) YELLOW   APPearance CLEAR (A) CLEAR   Specific Gravity, Urine 1.023 1.005 - 1.030   pH 5.0 5.0 - 8.0   Glucose, UA 50 (A) NEGATIVE mg/dL   Hgb urine dipstick SMALL (A) NEGATIVE   Bilirubin Urine NEGATIVE NEGATIVE   Ketones, ur NEGATIVE NEGATIVE mg/dL   Protein, ur NEGATIVE NEGATIVE mg/dL   Nitrite NEGATIVE NEGATIVE   Leukocytes,Ua NEGATIVE NEGATIVE   RBC / HPF 0-5 0 - 5 RBC/hpf  WBC, UA 0-5 0 - 5  WBC/hpf   Bacteria, UA NONE SEEN NONE SEEN   Squamous Epithelial / LPF 0-5 0 - 5   Mucus PRESENT     Comment: Performed at [facility], [address] [place], [place] [address]  Troponin I - Now Then Q6H     Status: Abnormal   Collection Time: 07/02/18 11:58 AM  Result Value Ref Range   Troponin I 1.01 (HH) <0.03 ng/mL    Comment: CRITICAL VALUE NOTED. VALUE IS CONSISTENT WITH PREVIOUSLY REPORTED/CALLED VALUE/HKP Performed at [facility], [address] [place], [place] [address]   Lipid panel     Status: None   Collection Time: 07/02/18 11:58 AM  Result Value Ref Range   Cholesterol 156 0 - 200 mg/dL   Triglycerides 29 <098<150 mg/dL   HDL 56 >11>40 mg/dL   Total CHOL/HDL Ratio 2.8 RATIO   VLDL 6 0 - 40 mg/dL   LDL Cholesterol 94 0 - 99 mg/dL    Comment:        Total Cholesterol/HDL:CHD Risk Coronary Heart Disease Risk Table                     Men   Women  1/2 Average Risk   3.4   3.3  Average Risk       5.0   4.4  2 X Average Risk   9.6   7.1  3 X Average Risk  23.4   11.0        Use the calculated Patient Ratio above and the CHD Risk Table to determine the patient's CHD Risk.        ATP III CLASSIFICATION (LDL):  <100     mg/dL   Optimal  [phone]  mg/dL   Near or Above                    Optimal  130-159  mg/dL   Borderline  [id number]  mg/dL   High  >086>190     mg/dL   Very High Performed at [facility], [address] [place], [place] [address]    Ct Head Wo Contrast  Result Date: 07/02/2018 CLINICAL DATA:  Gait disturbance, fall EXAM: CT HEAD WITHOUT CONTRAST TECHNIQUE: Contiguous axial images were obtained from the base of the skull through the vertex without intravenous contrast. COMPARISON:  12/11/2017 FINDINGS: Brain: Chronic lacunar infarct in the left periventricular white matter. There is atrophy and chronic small vessel disease changes. No acute intracranial abnormality. Specifically, no hemorrhage, hydrocephalus, mass lesion, acute  infarction, or significant intracranial injury. Vascular: No hyperdense vessel or unexpected calcification. Skull: No acute calvarial abnormality. Sinuses/Orbits: Visualized paranasal sinuses and mastoids clear. Orbital soft tissues unremarkable. Other: None IMPRESSION: Atrophy, chronic microvascular disease. No acute intracranial abnormality. Electronically Signed   By: Charlett NoseKevin  Dover M.D.   On: 07/02/2018 08:30   Ct Angio Chest Pe W And/or Wo Contrast  Result Date: 07/02/2018 CLINICAL DATA:  History of pulmonary embolus.  Elevated troponin. EXAM: CT ANGIOGRAPHY CHEST WITH CONTRAST TECHNIQUE: Multidetector CT imaging of the chest was performed using the standard protocol during bolus administration of intravenous contrast. Multiplanar CT image reconstructions and MIPs were obtained to evaluate the vascular anatomy. CONTRAST:  75mL OMNIPAQUE IOHEXOL 350 MG/ML SOLN COMPARISON:  04/15/2018 FINDINGS: Cardiovascular: No filling defects in the pulmonary arteries to suggest pulmonary emboli. Heart is normal size. Aorta is normal caliber. Aortic atherosclerosis and coronary artery calcifications. Mediastinum/Nodes: No  mediastinal, hilar, or axillary adenopathy. Lungs/Pleura: Advanced emphysema. No confluent opacities or effusions. Upper Abdomen: Imaging into the upper abdomen shows no acute findings. Musculoskeletal: Chest wall soft tissues are unremarkable. No acute bony abnormality. Review of the MIP images confirms the above findings. IMPRESSION: No evidence of pulmonary embolus. Coronary artery disease. Aortic Atherosclerosis (ICD10-I70.0) and Emphysema (ICD10-J43.9). Electronically Signed   By: Charlett Nose M.D.   On: 07/02/2018 09:13   Mr Brain Wo Contrast  Result Date: 07/02/2018 CLINICAL DATA:  Vertigo.  Unsteady gait.  Fall. EXAM: MRI HEAD WITHOUT CONTRAST TECHNIQUE: Multiplanar, multiecho pulse sequences of the brain and surrounding structures were obtained without intravenous contrast. COMPARISON:  Head CT  07/02/2018 and MRI 06/20/2015 FINDINGS: Brain: There is no evidence of acute infarct, mass, midline shift, or extra-axial fluid collection. A chronic infarct is again seen in the left basal ganglia and corona radiata with associated chronic blood products. T2 hyperintensities elsewhere in the cerebral white matter bilaterally have at most slightly progressed from the prior MRI and are nonspecific but compatible with chronic small vessel ischemic disease, mild for age. There is moderate cerebral atrophy. Vascular: Major intracranial vascular flow voids are preserved. Skull and upper cervical spine: Unremarkable calvarial bone marrow signal. Advanced cervical disc degeneration. Sinuses/Orbits: Bilateral cataract extraction. Clear paranasal sinuses. Small left mastoid effusion. Other: None. IMPRESSION: 1. No acute intracranial abnormality. 2. Mild chronic small vessel ischemic disease. Electronically Signed   By: Sebastian Ache M.D.   On: 07/02/2018 12:15   US Carotid Bilateral  Result Date: 07/02/2018 CLINICAL DATA:  CVA. History of visual disturbance, syncopal episode, hypertension and diabetes. EXAM: BILATERAL CAROTID DUPLEX ULTRASOUND TECHNIQUE: Wallace Cullens scale imaging, color Doppler and duplex ultrasound were performed of bilateral carotid and vertebral arteries in the neck. COMPARISON:  None. FINDINGS: Criteria: Quantification of carotid stenosis is based on velocity parameters that correlate the residual internal carotid diameter with NASCET-based stenosis levels, using the diameter of the distal internal carotid lumen as the denominator for stenosis measurement. The following velocity measurements were obtained: RIGHT ICA: 75/25 cm/sec CCA: 54/8 cm/sec SYSTOLIC ICA/CCA RATIO:  1.4 ECA: 70 cm/sec LEFT ICA: 71/26 cm/sec CCA: 50/11 cm/sec SYSTOLIC ICA/CCA RATIO:  1.4 ECA: 51 cm/sec RIGHT CAROTID ARTERY: There is a moderate to large amount of eccentric echogenic plaque within the right carotid bulb (image 14 and 16),  extending to involve the origin and proximal aspects of the right internal carotid artery (image 24), not resulting in elevated peak systolic velocities within the interrogated course of the right internal carotid artery to suggest a hemodynamically significant stenosis. RIGHT VERTEBRAL ARTERY:  Antegrade flow LEFT CAROTID ARTERY: There is a minimal amount of eccentric echogenic plaque scattered throughout the left common carotid artery (images 36, 40 and 44). There is a moderate amount of eccentric echogenic plaque within the left carotid bulb (images 47 and 49), not resulting in elevated peak systolic velocities within the interrogated course of the left internal carotid artery to suggest a hemodynamically significant stenosis. LEFT VERTEBRAL ARTERY:  Antegrade Flow IMPRESSION: Moderate to large amount of bilateral atherosclerotic plaque, right greater than left, not resulting in a hemodynamically significant stenosis within either internal carotid artery. Electronically Signed   By: Simonne Come M.D.   On: 07/02/2018 11:35   Dg Chest Port 1 View  Result Date: 07/02/2018 CLINICAL DATA:  Cough EXAM: PORTABLE CHEST 1 VIEW COMPARISON:  04/14/2018 FINDINGS: Interstitial coarsening correlating with emphysema by CT. Patchy density at the right base that was also seen previously  and without clear parenchymal correlate by CT. Normal heart size and stable mediastinal contours. IMPRESSION: 1. Stable from 04/14/2018.  No definite acute disease. 2. Emphysema. Electronically Signed   By: Marnee Spring M.D.   On: 07/02/2018 08:30    Pending Labs Unresulted Labs (From admission, onward)    Start     Ordered   07/03/18 0500  Basic metabolic panel  Tomorrow morning,   STAT     07/02/18 1202   07/03/18 0500  CBC  Tomorrow morning,   STAT     07/02/18 1202   07/03/18 0500  Heparin level (unfractionated)  Tomorrow morning,   STAT     07/02/18 1414   07/02/18 2200  APTT  Once-Timed,   STAT     07/02/18 1414    07/02/18 1411  Heparin level (unfractionated)  ONCE - STAT,   STAT     07/02/18 1414   07/02/18 1014  Hemoglobin A1c  Add-on,   AD     07/02/18 1014   07/02/18 1013  Troponin I - Now Then Q6H  Now then every 6 hours,   STAT     07/02/18 1013          Vitals/Pain Today's Vitals   07/02/18 1300 07/02/18 1400 07/02/18 1430 07/02/18 1436  BP: 117/87 (!) 113/95 135/82   Pulse: (!) 104 (!) 108    Resp: 18 (!) 26 20   Temp:      TempSrc:      SpO2: 93% 97%    Weight:      Height:      PainSc:    0-No pain    Isolation Precautions No active isolations  Medications Medications  aspirin EC tablet 81 mg (81 mg Oral Given 07/02/18 1208)  amLODipine (NORVASC) tablet 5 mg (5 mg Oral Given 07/02/18 1208)  losartan (COZAAR) tablet 100 mg (has no administration in time range)  pravastatin (PRAVACHOL) tablet 20 mg (has no administration in time range)  glipiZIDE (GLUCOTROL) tablet 5 mg (5 mg Oral Given 07/02/18 1435)  levothyroxine (SYNTHROID, LEVOTHROID) tablet 50 mcg (has no administration in time range)  docusate sodium (COLACE) capsule 100 mg (100 mg Oral Given 07/02/18 1208)  pantoprazole (PROTONIX) EC tablet 40 mg (40 mg Oral Given 07/02/18 1208)  docusate sodium (COLACE) capsule 100 mg (has no administration in time range)  heparin ADULT infusion 100 units/mL (25000 units/263mL sodium chloride 0.45%) (800 Units/hr Intravenous New Bag/Given 07/02/18 1432)  iohexol (OMNIPAQUE) 350 MG/ML injection 75 mL (75 mLs Intravenous Contrast Given 07/02/18 0852)  sodium chloride 0.9 % bolus 500 mL (0 mLs Intravenous Stopped 07/02/18 1028)  heparin bolus via infusion 4,000 Units (4,000 Units Intravenous Bolus from Bag 07/02/18 1434)    Mobility walks High fall risk   Focused Assessments Cardiac Assessment Handoff:  Cardiac Rhythm: Normal sinus rhythm Lab Results  Component Value Date   CKTOTAL 170 07/11/2013   CKMB 2.2 07/11/2013   TROPONINI 1.01 (HH) 07/02/2018   No results found for: DDIMER Does  the Patient currently have chest pain? No     R Recommendations: See Admitting Provider Note  Report given to:   Additional Notes:

## 2018-07-02 NOTE — ED Notes (Signed)
Pt watching tv

## 2018-07-02 NOTE — ED Notes (Signed)
Date and time results received: 07/02/18 8:38 AM  (use smartphrase ".now" to insert current time)  Test: troponin Critical Value: 0.87 Name of Provider Notified: Dr. Ileana Roup

## 2018-07-02 NOTE — ED Provider Notes (Addendum)
[facility] Emergency Department Provider Note  ____________________________________________   I have reviewed the triage vital signs and the nursing notes. Where available I have reviewed prior notes and, if possible and indicated, outside hospital notes.    HISTORY  Chief Complaint Gait Problem    HPI Ruben Ruiz is a 85 y.o. male  With a history of dementia, hard of hearing diabetes mellitus reflux disease, who has had PEs in the past and is supposed to be taking Eliquis but his wife, who is at bedside, states she has no idea what he is actually taking and he himself is having difficulty explaining to me, presents today after being noted to have an unsteady gait today.  He denies any symptoms and states he feels "great".  Family states that he was found on the ground yesterday, he denies this.  He states he was looking for something that he lost.  He denies any fall.  He has no pain.  He feels strong as an ox he states.  They state that he seems little more confused than normal today at least this morning but today he now seems closer to normal. Story per patient and family but very limited in both respects.  Level 5 chart caveat; no further history available due to patient status.  There is no chest pain there is no shortness of breath he complains of no focal numbness or weakness, family think that he is not walking as well as normal today but he states he is.  Past Medical History:  Diagnosis Date  . Dementia (HCC)   . Diabetes mellitus without complication (HCC)   . GERD (gastroesophageal reflux disease)   . HOH (hard of hearing)    AID  . Hypertension     Patient Active Problem List   Diagnosis Date Noted  . AKI (acute kidney injury) (HCC) 04/15/2018  . Hypertensive urgency 06/20/2015  . Hypoglycemia associated with diabetes (HCC) 06/20/2015  . Anemia 06/20/2015  . Syncope 06/18/2015    Past Surgical History:  Procedure Laterality Date  .  CATARACT EXTRACTION W/PHACO Left 08/29/2017   Procedure: CATARACT EXTRACTION PHACO AND INTRAOCULAR LENS PLACEMENT (IOC);  Surgeon: Lockie Mola, MD;  Location: [facility] ORS;  Service: Ophthalmology;  Laterality: Left;  [place] 01:45.0 AP% 31.2 CDE 24.15 Fluid Pack Lot # [id number]  . EYE SURGERY      Prior to Admission medications   Medication Sig Start Date End Date Taking? Authorizing Provider  amLODipine (NORVASC) 5 MG tablet Take 5 mg by mouth daily. 09/29/16   [provider]  aspirin EC 81 MG tablet Take 81 mg by mouth daily.    [provider]  docusate sodium (COLACE) 100 MG capsule Take 1 capsule (100 mg total) by mouth 2 (two) times daily. 04/16/18   Enid Baas, MD  ELIQUIS STARTER PACK (ELIQUIS STARTER PACK) 5 MG TABS Take as directed on package: start with two-5mg  tablets twice daily for 7 days. On day 8, switch to one-5mg  tablet twice daily. 04/16/18   Enid Baas, MD  glipiZIDE (GLUCOTROL) 5 MG tablet Take 1 tablet (5 mg total) by mouth daily. 04/16/18 04/16/19  Enid Baas, MD  levothyroxine (SYNTHROID, LEVOTHROID) 50 MCG tablet Take 1 tablet (50 mcg total) by mouth daily before breakfast. 04/17/18   Enid Baas, MD  losartan (COZAAR) 100 MG tablet Take 100 mg by mouth at bedtime.     [provider]  omeprazole (PRILOSEC) 20 MG capsule Take 40 mg by mouth at  bedtime.     [provider]  pravastatin (PRAVACHOL) 20 MG tablet Take 20 mg by mouth at bedtime.     [provider]    Allergies Patient has no known allergies.  No family history on file.  Social History Social History   Tobacco Use  . Smoking status: Former Games developer  . Smokeless tobacco: Never Used  Substance Use Topics  . Alcohol use: No  . Drug use: Never    Review of Systems Constitutional: No fever/chills Eyes: No visual changes. ENT: No sore throat. No stiff neck no neck pain Cardiovascular: Denies chest pain. Respiratory: Denies  shortness of breath. Gastrointestinal:   no vomiting.  No diarrhea.  No constipation. Genitourinary: Negative for dysuria. Musculoskeletal: Negative lower extremity swelling Skin: Negative for rash. Neurological: Negative for severe headaches, focal weakness or numbness.   ____________________________________________   PHYSICAL EXAM:  VITAL SIGNS: ED Triage Vitals [07/02/18 0729]  Enc Vitals Group     BP (!) 148/92     Pulse Rate 99     Resp 18     Temp 97.8 F (36.6 C)     Temp Source Oral     SpO2 90 %     Weight 154 lb (69.9 kg)     Height 5\' 9"  (1.753 m)     Head Circumference      Peak Flow      Pain Score 0     Pain Loc      Pain Edu?      Excl. in GC?     Constitutional: Alert and oriented to name and place unsure of the date. Well appearing and in no acute distress. Eyes: Conjunctivae are normal Head: Atraumatic HEENT: No congestion/rhinnorhea. Mucous membranes are moist.  Oropharynx non-erythematous Neck:   Nontender with no meningismus, no masses, no stridor Cardiovascular: Normal rate, regular rhythm. Grossly normal heart sounds.  Good peripheral circulation. Respiratory: Normal respiratory effort.  No retractions. Lungs CTAB. Abdominal: Soft and nontender. No distention. No guarding no rebound Back:  There is no focal tenderness or step off.  there is no midline tenderness there are no lesions noted. there is no CVA tenderness Musculoskeletal: No lower extremity tenderness, no upper extremity tenderness. No joint effusions, no DVT signs strong distal pulses no edema Neurologic:  Normal speech and language. No gross focal neurologic deficits are appreciated.  Skin:  Skin is warm, dry and intact. No rash noted. Psychiatric: Mood and affect are normal. Speech and behavior are normal.  ____________________________________________   LABS (all labs ordered are listed, but only abnormal results are displayed)  Labs Reviewed  COMPREHENSIVE METABOLIC PANEL -  Abnormal; Notable for the following components:      Result Value   Glucose, Bld 191 (*)    Creatinine, Ser 1.31 (*)    GFR calc non Af Amer 45 (*)    GFR calc Af Amer 52 (*)    All other components within normal limits  TROPONIN I - Abnormal; Notable for the following components:   Troponin I 0.87 (*)    All other components within normal limits  CBC WITH DIFFERENTIAL/PLATELET  PROTIME-INR  APTT  ETHANOL  URINALYSIS, COMPLETE (UACMP) WITH MICROSCOPIC    Pertinent labs  results that were available during my care of the patient were reviewed by me and considered in my medical decision making (see chart for details). ____________________________________________  EKG  I personally interpreted any EKGs ordered by me or triage Sinus rate 99 no acute  ST elevation or depression normal axis unspecific ST changes ____________________________________________  RADIOLOGY  Pertinent labs & imaging results that were available during my care of the patient were reviewed by me and considered in my medical decision making (see chart for details). If possible, patient and/or family made aware of any abnormal findings.  Ct Head Wo Contrast  Result Date: 07/02/2018 CLINICAL DATA:  Gait disturbance, fall EXAM: CT HEAD WITHOUT CONTRAST TECHNIQUE: Contiguous axial images were obtained from the base of the skull through the vertex without intravenous contrast. COMPARISON:  12/11/2017 FINDINGS: Brain: Chronic lacunar infarct in the left periventricular white matter. There is atrophy and chronic small vessel disease changes. No acute intracranial abnormality. Specifically, no hemorrhage, hydrocephalus, mass lesion, acute infarction, or significant intracranial injury. Vascular: No hyperdense vessel or unexpected calcification. Skull: No acute calvarial abnormality. Sinuses/Orbits: Visualized paranasal sinuses and mastoids clear. Orbital soft tissues unremarkable. Other: None IMPRESSION: Atrophy, chronic  microvascular disease. No acute intracranial abnormality. Electronically Signed   By: Charlett Nose M.D.   On: 07/02/2018 08:30   Dg Chest Port 1 View  Result Date: 07/02/2018 CLINICAL DATA:  Cough EXAM: PORTABLE CHEST 1 VIEW COMPARISON:  04/14/2018 FINDINGS: Interstitial coarsening correlating with emphysema by CT. Patchy density at the right base that was also seen previously and without clear parenchymal correlate by CT. Normal heart size and stable mediastinal contours. IMPRESSION: 1. Stable from 04/14/2018.  No definite acute disease. 2. Emphysema. Electronically Signed   By: Marnee Spring M.D.   On: 07/02/2018 08:30   ____________________________________________    PROCEDURES  Procedure(s) performed: None  Procedures  Critical Care performed: None  ____________________________________________   INITIAL IMPRESSION / ASSESSMENT AND PLAN / ED COURSE  Pertinent labs & imaging results that were available during my care of the patient were reviewed by me and considered in my medical decision making (see chart for details).  Brought the patient in because he reportedly was on the ground yesterday and has unsteady gait today.  Pt walks with steady gait. He may be on Eliquis, is unclear if he is actually taking it.  Vital signs are reassuring, blood work is reassuring with the exception of a positive troponin 0.87.  Unclear the etiology for this.  He does have a recent history of PE, and we do not know if he is taking blood thinners, and he cannot really tell us.    ____________________________________________   FINAL CLINICAL IMPRESSION(S) / ED DIAGNOSES  Final diagnoses:  Cough      This chart was dictated using voice recognition software.  Despite best efforts to proofread,  errors can occur which can change meaning.      Jeanmarie Plant, MD 07/02/18 1610    Jeanmarie Plant, MD 07/02/18 [phone]

## 2018-07-02 NOTE — ED Notes (Signed)
Paged admitting doctor regarding repeat troponin results.

## 2018-07-02 NOTE — ED Notes (Signed)
fsbs 82

## 2018-07-02 NOTE — ED Notes (Signed)
Patient transported to Ultrasound 

## 2018-07-02 NOTE — Consult Note (Signed)
Reason for Consult: Borderline troponins altered mental status Referring Physician: Dr. Alphonzo LemmingsMcShane Dr. Elisabeth PigeonVachhani hospitalist  Ruben SinJames Ruiz is an 85 y.o. male.  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  Patient had trouble with memories became unsteady on his feet he was found on the ground yesterday is unclear whether he fell or had syncope denies any pain patient states he feels relatively strong there was some evidence of confusion patient complains of no focal weakness or numbness no seizure activity denies any chest pain or shortness of breath.  Troponins returned borderline no known previous cardiac history  Past Medical History:  Diagnosis Date  . Dementia (HCC)   . Diabetes mellitus without complication (HCC)   . GERD (gastroesophageal reflux disease)   . HOH (hard of hearing)    AID  . Hypertension     Past Surgical History:  Procedure Laterality Date  . CATARACT EXTRACTION W/PHACO Left 08/29/2017   Procedure: CATARACT EXTRACTION PHACO AND INTRAOCULAR LENS PLACEMENT (IOC);  Surgeon: Lockie MolaBrasington, Chadwick, MD;  Location: [facility] ORS;  Service: Ophthalmology;  Laterality: Left;  US 01:45.0 AP% 31.2 CDE 24.15 Fluid Pack Lot # [id number]  . EYE SURGERY      No family history on file.  Social History:  reports that he has quit smoking. He has never used smokeless tobacco. He reports that he does not drink alcohol or use drugs.  Allergies: No Known Allergies  Medications: I have reviewed the patient's current medications.  Results for orders placed or performed during the hospital encounter of 07/02/18 (from the past 48 hour(s))  CBC with Differential     Status: None   Collection Time: 07/02/18  7:54 AM  Result Value Ref Range   WBC 7.4 4.0 - 10.5 K/uL   RBC 5.21 4.22 - 5.81 MIL/uL   Hemoglobin  13.7 13.0 - 17.0 g/dL   HCT 45.443.9 09.839.0 - 11.952.0 %   MCV 84.3 80.0 - 100.0 fL   MCH 26.3 26.0 - 34.0 pg   MCHC 31.2 30.0 - 36.0 g/dL   RDW 14.713.5 82.911.5 - 56.215.5 %   Platelets 375 150 - 400 K/uL   nRBC 0.0 0.0 - 0.2 %   Neutrophils Relative % 70 %   Neutro Abs 5.1 1.7 - 7.7 K/uL   Lymphocytes Relative 15 %   Lymphs Abs 1.1 0.7 - 4.0 K/uL   Monocytes Relative 11 %   Monocytes Absolute 0.8 0.1 - 1.0 K/uL   Eosinophils Relative 3 %   Eosinophils Absolute 0.3 0.0 - 0.5 K/uL   Basophils Relative 1 %   Basophils Absolute 0.1 0.0 - 0.1 K/uL   Immature Granulocytes 0 %   Abs Immature Granulocytes 0.02 0.00 - 0.07 K/uL    Comment: Performed at [facility], [address] [place], [place] [address]  Protime-INR     Status: None   Collection Time: 07/02/18  7:54 AM  Result Value Ref Range   Prothrombin Time 14.9 11.4 - 15.2 seconds   INR 1.2 0.8 - 1.2    Comment: (NOTE) INR goal varies based on device and disease states. Performed at [facility], [address] [place], [place] [address]   Comprehensive metabolic panel     Status: Abnormal   Collection Time: 07/02/18  7:54 AM  Result Value Ref Range  Sodium 138 135 - 145 mmol/L   Potassium 3.9 3.5 - 5.1 mmol/L   Chloride 103 98 - 111 mmol/L   CO2 23 22 - 32 mmol/L   Glucose, Bld 191 (H) 70 - 99 mg/dL   BUN 21 8 - 23 mg/dL   Creatinine, Ser 0.10 (H) 0.61 - 1.24 mg/dL   Calcium 8.9 8.9 - 40.4 mg/dL   Total Protein 7.4 6.5 - 8.1 g/dL   Albumin 3.5 3.5 - 5.0 g/dL   AST 22 15 - 41 U/L   ALT 12 0 - 44 U/L   Alkaline Phosphatase 73 38 - 126 U/L   Total Bilirubin 1.2 0.3 - 1.2 mg/dL   GFR calc non Af Amer 45 (L) >60 mL/min   GFR calc Af Amer 52 (L) >60 mL/min   Anion gap 12 5 - 15    Comment: Performed at [facility], [address] [place], [place] [address]  APTT     Status: None   Collection Time: 07/02/18  7:54 AM  Result Value Ref Range   aPTT 35 24 - 36 seconds    Comment: Performed at [facility], [address] [place], [place] [address]  Ethanol     Status: None   Collection Time: 07/02/18  7:54 AM  Result Value Ref Range   Alcohol, Ethyl (B) <10 <10 mg/dL    Comment: (NOTE) Lowest detectable limit for serum alcohol is 10 mg/dL. For medical purposes only. Performed at [facility], [address] [place], [place] [address]   Troponin I - Once     Status: Abnormal   Collection Time: 07/02/18  7:54 AM  Result Value Ref Range   Troponin I 0.87 (HH) <0.03 ng/mL    Comment: CRITICAL RESULT CALLED TO, READ BACK BY AND VERIFIED WITH ALLY RILEY@[id number]  ON 07/02/18 BY HKP Performed at [facility], [address] [place], [place] [address]   Urinalysis, Complete w Microscopic     Status: Abnormal   Collection Time: 07/02/18  7:54 AM  Result Value Ref Range   Color, Urine YELLOW (A) YELLOW   APPearance CLEAR (A) CLEAR   Specific Gravity, Urine 1.023 1.005 - 1.030   pH 5.0 5.0 - 8.0   Glucose, UA 50 (A) NEGATIVE mg/dL   Hgb urine dipstick SMALL (A) NEGATIVE   Bilirubin Urine NEGATIVE NEGATIVE   Ketones, ur NEGATIVE NEGATIVE mg/dL   Protein, ur NEGATIVE NEGATIVE mg/dL   Nitrite NEGATIVE NEGATIVE   Leukocytes,Ua NEGATIVE NEGATIVE   RBC / HPF 0-5 0 - 5 RBC/hpf   WBC, UA 0-5 0 - 5 WBC/hpf   Bacteria, UA NONE SEEN NONE SEEN   Squamous Epithelial / LPF 0-5 0 - 5   Mucus PRESENT     Comment: Performed at [facility], [address] [place], [place] [address]  Troponin I - Now Then Q6H     Status: Abnormal   Collection Time: 07/02/18 11:58 AM  Result Value Ref Range   Troponin I 1.01 (HH) <0.03 ng/mL    Comment: CRITICAL VALUE NOTED. VALUE IS CONSISTENT WITH PREVIOUSLY REPORTED/CALLED VALUE/HKP Performed at [facility], [address] [place], [place] [address]   Lipid panel     Status: None   Collection Time: 07/02/18 11:58 AM  Result Value Ref Range   Cholesterol 156 0 - 200 mg/dL   Triglycerides 29 <447 mg/dL   HDL  56 >39 mg/dL   Total CHOL/HDL Ratio 2.8 RATIO  VLDL 6 0 - 40 mg/dL   LDL Cholesterol 94 0 - 99 mg/dL    Comment:        Total Cholesterol/HDL:CHD Risk Coronary Heart Disease Risk Table                     Men   Women  1/2 Average Risk   3.4   3.3  Average Risk       5.0   4.4  2 X Average Risk   9.6   7.1  3 X Average Risk  23.4   11.0        Use the calculated Patient Ratio above and the CHD Risk Table to determine the patient's CHD Risk.        ATP III CLASSIFICATION (LDL):  <100     mg/dL   Optimal  696-295  mg/dL   Near or Above                    Optimal  130-159  mg/dL   Borderline  284-132  mg/dL   High  >440     mg/dL   Very High Performed at [facility], [address] [place], [place] [address]   Hemoglobin A1c     Status: Abnormal   Collection Time: 07/02/18 11:58 AM  Result Value Ref Range   Hgb A1c MFr Bld 7.4 (H) 4.8 - 5.6 %    Comment: (NOTE) Pre diabetes:          5.7%-6.4% Diabetes:              >6.4% Glycemic control for   <7.0% adults with diabetes    Mean Plasma Glucose 165.68 mg/dL    Comment: Performed at [facility], [address] [place], [place] [address]  Troponin I - Now Then Q6H     Status: Abnormal   Collection Time: 07/02/18  4:16 PM  Result Value Ref Range   Troponin I 0.63 (HH) <0.03 ng/mL    Comment: CRITICAL VALUE NOTED. VALUE IS CONSISTENT WITH PREVIOUSLY REPORTED/CALLED VALUE/JUW Performed at [facility], [address] [place], [place] [address]   Heparin level (unfractionated)     Status: None   Collection Time: 07/02/18  4:16 PM  Result Value Ref Range   Heparin Unfractionated 0.58 0.30 - 0.70 IU/mL    Comment: (NOTE) If heparin results are below expected values, and patient dosage has  been confirmed, suggest follow up testing of antithrombin III levels. Performed at [facility], [address] [place], [place] [address]   Glucose, capillary     Status: Abnormal   Collection  Time: 07/02/18  6:44 PM  Result Value Ref Range   Glucose-Capillary 124 (H) 70 - 99 mg/dL    Ct Head Wo Contrast  Result Date: 07/02/2018 CLINICAL DATA:  Gait disturbance, fall EXAM: CT HEAD WITHOUT CONTRAST TECHNIQUE: Contiguous axial images were obtained from the base of the skull through the vertex without intravenous contrast. COMPARISON:  12/11/2017 FINDINGS: Brain: Chronic lacunar infarct in the left periventricular white matter. There is atrophy and chronic small vessel disease changes. No acute intracranial abnormality. Specifically, no hemorrhage, hydrocephalus, mass lesion, acute infarction, or significant intracranial injury. Vascular: No hyperdense vessel or unexpected calcification. Skull: No acute calvarial abnormality. Sinuses/Orbits: Visualized paranasal sinuses and mastoids clear. Orbital soft tissues unremarkable. Other: None IMPRESSION: Atrophy, chronic microvascular disease. No acute intracranial abnormality. Electronically Signed   By: Charlett Nose M.D.  On: 07/02/2018 08:30   Ct Angio Chest Pe W And/or Wo Contrast  Result Date: 07/02/2018 CLINICAL DATA:  History of pulmonary embolus.  Elevated troponin. EXAM: CT ANGIOGRAPHY CHEST WITH CONTRAST TECHNIQUE: Multidetector CT imaging of the chest was performed using the standard protocol during bolus administration of intravenous contrast. Multiplanar CT image reconstructions and MIPs were obtained to evaluate the vascular anatomy. CONTRAST:  75mL OMNIPAQUE IOHEXOL 350 MG/ML SOLN COMPARISON:  04/15/2018 FINDINGS: Cardiovascular: No filling defects in the pulmonary arteries to suggest pulmonary emboli. Heart is normal size. Aorta is normal caliber. Aortic atherosclerosis and coronary artery calcifications. Mediastinum/Nodes: No mediastinal, hilar, or axillary adenopathy. Lungs/Pleura: Advanced emphysema. No confluent opacities or effusions. Upper Abdomen: Imaging into the upper abdomen shows no acute findings. Musculoskeletal: Chest wall  soft tissues are unremarkable. No acute bony abnormality. Review of the MIP images confirms the above findings. IMPRESSION: No evidence of pulmonary embolus. Coronary artery disease. Aortic Atherosclerosis (ICD10-I70.0) and Emphysema (ICD10-J43.9). Electronically Signed   By: Charlett Nose M.D.   On: 07/02/2018 09:13   Mr Brain Wo Contrast  Result Date: 07/02/2018 CLINICAL DATA:  Vertigo.  Unsteady gait.  Fall. EXAM: MRI HEAD WITHOUT CONTRAST TECHNIQUE: Multiplanar, multiecho pulse sequences of the brain and surrounding structures were obtained without intravenous contrast. COMPARISON:  Head CT 07/02/2018 and MRI 06/20/2015 FINDINGS: Brain: There is no evidence of acute infarct, mass, midline shift, or extra-axial fluid collection. A chronic infarct is again seen in the left basal ganglia and corona radiata with associated chronic blood products. T2 hyperintensities elsewhere in the cerebral white matter bilaterally have at most slightly progressed from the prior MRI and are nonspecific but compatible with chronic small vessel ischemic disease, mild for age. There is moderate cerebral atrophy. Vascular: Major intracranial vascular flow voids are preserved. Skull and upper cervical spine: Unremarkable calvarial bone marrow signal. Advanced cervical disc degeneration. Sinuses/Orbits: Bilateral cataract extraction. Clear paranasal sinuses. Small left mastoid effusion. Other: None. IMPRESSION: 1. No acute intracranial abnormality. 2. Mild chronic small vessel ischemic disease. Electronically Signed   By: Sebastian Ache M.D.   On: 07/02/2018 12:15   US Carotid Bilateral  Result Date: 07/02/2018 CLINICAL DATA:  CVA. History of visual disturbance, syncopal episode, hypertension and diabetes. EXAM: BILATERAL CAROTID DUPLEX ULTRASOUND TECHNIQUE: Wallace Cullens scale imaging, color Doppler and duplex ultrasound were performed of bilateral carotid and vertebral arteries in the neck. COMPARISON:  None. FINDINGS: Criteria:  Quantification of carotid stenosis is based on velocity parameters that correlate the residual internal carotid diameter with NASCET-based stenosis levels, using the diameter of the distal internal carotid lumen as the denominator for stenosis measurement. The following velocity measurements were obtained: RIGHT ICA: 75/25 cm/sec CCA: 54/8 cm/sec SYSTOLIC ICA/CCA RATIO:  1.4 ECA: 70 cm/sec LEFT ICA: 71/26 cm/sec CCA: 50/11 cm/sec SYSTOLIC ICA/CCA RATIO:  1.4 ECA: 51 cm/sec RIGHT CAROTID ARTERY: There is a moderate to large amount of eccentric echogenic plaque within the right carotid bulb (image 14 and 16), extending to involve the origin and proximal aspects of the right internal carotid artery (image 24), not resulting in elevated peak systolic velocities within the interrogated course of the right internal carotid artery to suggest a hemodynamically significant stenosis. RIGHT VERTEBRAL ARTERY:  Antegrade flow LEFT CAROTID ARTERY: There is a minimal amount of eccentric echogenic plaque scattered throughout the left common carotid artery (images 36, 40 and 44). There is a moderate amount of eccentric echogenic plaque within the left carotid bulb (images 47 and 49), not resulting in elevated peak  systolic velocities within the interrogated course of the left internal carotid artery to suggest a hemodynamically significant stenosis. LEFT VERTEBRAL ARTERY:  Antegrade Flow IMPRESSION: Moderate to large amount of bilateral atherosclerotic plaque, right greater than left, not resulting in a hemodynamically significant stenosis within either internal carotid artery. Electronically Signed   By: Simonne Come M.D.   On: 07/02/2018 11:35   Dg Chest Port 1 View  Result Date: 07/02/2018 CLINICAL DATA:  Cough EXAM: PORTABLE CHEST 1 VIEW COMPARISON:  04/14/2018 FINDINGS: Interstitial coarsening correlating with emphysema by CT. Patchy density at the right base that was also seen previously and without clear parenchymal  correlate by CT. Normal heart size and stable mediastinal contours. IMPRESSION: 1. Stable from 04/14/2018.  No definite acute disease. 2. Emphysema. Electronically Signed   By: Marnee Spring M.D.   On: 07/02/2018 08:30    Review of Systems  Constitutional: Positive for diaphoresis and malaise/fatigue.  HENT: Positive for congestion and hearing loss.   Respiratory: Negative.   Cardiovascular: Negative.   Gastrointestinal: Negative.   Genitourinary: Negative.   Musculoskeletal: Positive for joint pain and myalgias.  Skin: Negative.   Neurological: Positive for dizziness.  Endo/Heme/Allergies: Negative.   Psychiatric/Behavioral: Positive for memory loss. The patient is nervous/anxious.    Blood pressure 132/80, pulse 95, temperature 98.2 F (36.8 C), temperature source Oral, resp. rate (!) 21, height 5\' 9"  (1.753 m), weight 69.9 kg, SpO2 96 %. Physical Exam  Nursing note and vitals reviewed. Constitutional: He is oriented to person, place, and time. He appears well-developed and well-nourished.  HENT:  Head: Normocephalic and atraumatic.  Eyes: Pupils are equal, round, and reactive to light. Conjunctivae and EOM are normal.  Neck: Normal range of motion. Neck supple.  Cardiovascular: Normal rate, regular rhythm and normal heart sounds.  Respiratory: Effort normal and breath sounds normal.  GI: Soft. Bowel sounds are normal.  Musculoskeletal: Normal range of motion.  Neurological: He is alert and oriented to person, place, and time. He has normal reflexes.  Skin: Skin is warm and dry.  Psychiatric: His speech is normal. Judgment and thought content normal. His mood appears anxious. He is slowed. Cognition and memory are impaired. He exhibits abnormal recent memory.    Assessment/Plan: Altered mental status Dementia Diabetes GERD Hypertension Hearingr loss . Plan Patient currently admitted with altered mental status Follow-up borderline troponins Consider echo and EKG Do  not recommend invasive strategy Strongly recommend conservative medical therapy at this point Troponins may represent demand ischemia   Ruben Ruiz 07/02/2018, 10:56 PM

## 2018-07-02 NOTE — ED Notes (Signed)
Pt sitting up taking pills and drinking water   nsr on monitor.  Pt waiting on admission bed.

## 2018-07-02 NOTE — Progress Notes (Addendum)
ANTICOAGULATION CONSULT NOTE - Initial Consult  Pharmacy Consult for Heparin Indication: chest pain/ACS  No Known Allergies  Patient Measurements: Height: 5\' 9"  (175.3 cm) Weight: 154 lb (69.9 kg) IBW/kg (Calculated) : 70.7 Heparin Dosing Weight: 69.9 kg  Vital Signs: Temp: 97.8 F (36.6 C) (03/03 0729) Temp Source: Oral (03/03 0729) BP: 153/95 (03/03 1200) Pulse Rate: 95 (03/03 1200)  Labs: Recent Labs    07/02/18 0754 07/02/18 1158  HGB 13.7  --   HCT 43.9  --   PLT 375  --   APTT 35  --   LABPROT 14.9  --   INR 1.2  --   CREATININE 1.31*  --   TROPONINI 0.87* 1.01*    Estimated Creatinine Clearance: 31.1 mL/min (A) (by C-G formula based on SCr of 1.31 mg/dL (H)).   Medical History: Past Medical History:  Diagnosis Date  . Dementia (HCC)   . Diabetes mellitus without complication (HCC)   . GERD (gastroesophageal reflux disease)   . HOH (hard of hearing)    AID  . Hypertension     Medications:  (Not in a hospital admission)  Scheduled:  . amLODipine  5 mg Oral Daily  . aspirin EC  81 mg Oral Daily  . docusate sodium  100 mg Oral BID  . glipiZIDE  5 mg Oral Daily  . heparin injection (subcutaneous)  5,000 Units Subcutaneous Q8H  . [START ON 07/03/2018] levothyroxine  50 mcg Oral QAC breakfast  . losartan  100 mg Oral QHS  . pantoprazole  40 mg Oral Daily  . pravastatin  20 mg Oral QHS   Infusions:   PRN: docusate sodium  Assessment: Patient on apixaban for previous VTE. Pharmacy consulted for heparin dosing for elevated troponin.    Goal of Therapy:  APTT goal of 66-102 seconds.  Heparin level 0.3-0.7 units/ml when aPTT and heparin level correlated  Monitor platelets by anticoagulation protocol: Yes   Plan:  Give 4000 units bolus x 1 Start heparin infusion at 8000 units/hr Check aPTT level in 8 hours and daily while on heparin Continue to monitor H&H and platelets  Ronnald Ramp, PharmD, BCPS 07/02/2018,2:06 PM

## 2018-07-02 NOTE — ED Notes (Signed)
Dr. Elisabeth Pigeon verbally acknowledges second troponin. No further orders received at this time.

## 2018-07-02 NOTE — H&P (Signed)
[facility] [place] [facility]   PATIENT NAME: Ruben Ruiz    MR#:  [id number]  DATE OF BIRTH:  1918-03-07  DATE OF ADMISSION:  07/02/2018  PRIMARY CARE PHYSICIAN: Hillery Aldo, MD   REQUESTING/REFERRING PHYSICIAN: McShane  CHIEF COMPLAINT:   Chief Complaint  Patient presents with  . Gait Problem    HISTORY OF PRESENT ILLNESS: Ruben Ruiz  is a 85 y.o. male with a known history of dementia, diabetes, gastroesophageal reflux disease, hypertension-lives at home and walks with a cane sometimes but not very reliable on his medications.  He was also on Eliquis for PE.  For last 1 to 2 days he has some dizziness and imbalance so came to hospital.  He is not a very good historian.  His wife was in the room during my visit but she also could not give a clear history but she said that patient has dementia and he does not take his medication reliably.\ Because of his weight complaints ER did multiple work-up including CT scan of the head, CT scan of chest with angiogram, troponin levels and lab reports which all of them where noncontributory except for very high troponin.  Patient did not had any chest Pain complaint but because of significant chest pain, he advised to admit.  PAST MEDICAL HISTORY:   Past Medical History:  Diagnosis Date  . Dementia (HCC)   . Diabetes mellitus without complication (HCC)   . GERD (gastroesophageal reflux disease)   . HOH (hard of hearing)    AID  . Hypertension     PAST SURGICAL HISTORY:  Past Surgical History:  Procedure Laterality Date  . CATARACT EXTRACTION W/PHACO Left 08/29/2017   Procedure: CATARACT EXTRACTION PHACO AND INTRAOCULAR LENS PLACEMENT (IOC);  Surgeon: Lockie Mola, MD;  Location: [facility] ORS;  Service: Ophthalmology;  Laterality: Left;  [place] 01:45.0 AP% 31.2 CDE 24.15 Fluid Pack Lot # [id number]  . EYE SURGERY      SOCIAL HISTORY:  Social History   Tobacco Use  . Smoking status: Former Games developer  . Smokeless  tobacco: Never Used  Substance Use Topics  . Alcohol use: No    FAMILY HISTORY: No family history on file.  DRUG ALLERGIES: No Known Allergies  REVIEW OF SYSTEMS:   CONSTITUTIONAL: No fever,have fatigue or weakness.  EYES: No blurred or double vision.  EARS, NOSE, AND THROAT: No tinnitus or ear pain.  RESPIRATORY: No cough, shortness of breath, wheezing or hemoptysis.  CARDIOVASCULAR: No chest pain, orthopnea, edema.  GASTROINTESTINAL: No nausea, vomiting, diarrhea or abdominal pain.  GENITOURINARY: No dysuria, hematuria.  ENDOCRINE: No polyuria, nocturia,  HEMATOLOGY: No anemia, easy bruising or bleeding SKIN: No rash or lesion. MUSCULOSKELETAL: No joint pain or arthritis.   NEUROLOGIC: No tingling, numbness, weakness.  PSYCHIATRY: No anxiety or depression.   MEDICATIONS AT HOME:  Prior to Admission medications   Medication Sig Start Date End Date Taking? Authorizing Provider  amLODipine (NORVASC) 5 MG tablet Take 5 mg by mouth daily. 09/29/16  Yes [provider]  docusate sodium (COLACE) 100 MG capsule Take 1 capsule (100 mg total) by mouth 2 (two) times daily. 04/16/18  Yes Enid Baas, MD  ELIQUIS STARTER PACK (ELIQUIS STARTER PACK) 5 MG TABS Take as directed on package: start with two-5mg  tablets twice daily for 7 days. On day 8, switch to one-5mg  tablet twice daily. Patient taking differently: Take 5 mg by mouth 2 (two) times daily. Take as directed on package: start with two-5mg  tablets twice  daily for 7 days. On day 8, switch to one-5mg  tablet twice daily. 04/16/18  Yes Enid Baas, MD  glipiZIDE (GLUCOTROL) 5 MG tablet Take 1 tablet (5 mg total) by mouth daily. 04/16/18 04/16/19 Yes Enid Baas, MD  levothyroxine (SYNTHROID, LEVOTHROID) 50 MCG tablet Take 1 tablet (50 mcg total) by mouth daily before breakfast. 04/17/18  Yes Enid Baas, MD  losartan (COZAAR) 100 MG tablet Take 100 mg by mouth at bedtime.    Yes [provider]   triamcinolone cream (KENALOG) 0.1 % Apply 1 application topically 2 (two) times daily.   Yes [provider]      PHYSICAL EXAMINATION:   VITAL SIGNS: Blood pressure 121/71, pulse 97, temperature 97.8 F (36.6 C), temperature source Oral, resp. rate (!) 21, height 5\' 9"  (1.753 m), weight 69.9 kg, SpO2 93 %.  GENERAL:  85 y.o.-year-old patient lying in the bed with no acute distress.  EYES: Pupils equal, round, reactive to light and accommodation. No scleral icterus. Extraocular muscles intact.  HEENT: Head atraumatic, normocephalic. Oropharynx and nasopharynx clear.  NECK:  Supple, no jugular venous distention. No thyroid enlargement, no tenderness.  LUNGS: Normal breath sounds bilaterally, no wheezing, rales,rhonchi or crepitation. No use of accessory muscles of respiration.  CARDIOVASCULAR: S1, S2 normal. No murmurs, rubs, or gallops.  ABDOMEN: Soft, nontender, nondistended. Bowel sounds present. No organomegaly or mass.  EXTREMITIES: No pedal edema, cyanosis, or clubbing.  NEUROLOGIC: Cranial nerves II through XII are intact. Muscle strength 4/5 in all extremities. Sensation intact. Gait not checked.  PSYCHIATRIC: The patient is alert and oriented x 2.  SKIN: No obvious rash, lesion, or ulcer.   LABORATORY PANEL:   CBC Recent Labs  Lab 07/02/18 0754  WBC 7.4  HGB 13.7  HCT 43.9  PLT 375  MCV 84.3  MCH 26.3  MCHC 31.2  RDW 13.5  LYMPHSABS 1.1  MONOABS 0.8  EOSABS 0.3  BASOSABS 0.1   ------------------------------------------------------------------------------------------------------------------  Chemistries  Recent Labs  Lab 07/02/18 0754  NA 138  K 3.9  CL 103  CO2 23  GLUCOSE 191*  BUN 21  CREATININE 1.31*  CALCIUM 8.9  AST 22  ALT 12  ALKPHOS 73  BILITOT 1.2   ------------------------------------------------------------------------------------------------------------------ estimated creatinine clearance is 31.1 mL/min (A) (by C-G formula  based on SCr of 1.31 mg/dL (H)). ------------------------------------------------------------------------------------------------------------------ No results for input(s): TSH, T4TOTAL, T3FREE, THYROIDAB in the last 72 hours.  Invalid input(s): FREET3   Coagulation profile Recent Labs  Lab 07/02/18 0754  INR 1.2   ------------------------------------------------------------------------------------------------------------------- No results for input(s): DDIMER in the last 72 hours. -------------------------------------------------------------------------------------------------------------------  Cardiac Enzymes Recent Labs  Lab 07/02/18 0754 07/02/18 1158  TROPONINI 0.87* 1.01*   ------------------------------------------------------------------------------------------------------------------ Invalid input(s): POCBNP  ---------------------------------------------------------------------------------------------------------------  Urinalysis    Component Value Date/Time   COLORURINE YELLOW (A) 07/02/2018 0754   APPEARANCEUR CLEAR (A) 07/02/2018 0754   APPEARANCEUR Clear 01/09/2014 1838   LABSPEC 1.023 07/02/2018 0754   LABSPEC 1.002 01/09/2014 1838   PHURINE 5.0 07/02/2018 0754   GLUCOSEU 50 (A) 07/02/2018 0754   GLUCOSEU >=500 01/09/2014 1838   HGBUR SMALL (A) 07/02/2018 0754   BILIRUBINUR NEGATIVE 07/02/2018 0754   BILIRUBINUR Negative 01/09/2014 1838   KETONESUR NEGATIVE 07/02/2018 0754   PROTEINUR NEGATIVE 07/02/2018 0754   NITRITE NEGATIVE 07/02/2018 0754   LEUKOCYTESUR NEGATIVE 07/02/2018 0754   LEUKOCYTESUR Negative 01/09/2014 1838     RADIOLOGY: Ct Head Wo Contrast  Result Date: 07/02/2018 CLINICAL DATA:  Gait disturbance, fall EXAM: CT HEAD WITHOUT CONTRAST  TECHNIQUE: Contiguous axial images were obtained from the base of the skull through the vertex without intravenous contrast. COMPARISON:  12/11/2017 FINDINGS: Brain: Chronic lacunar infarct in the left  periventricular white matter. There is atrophy and chronic small vessel disease changes. No acute intracranial abnormality. Specifically, no hemorrhage, hydrocephalus, mass lesion, acute infarction, or significant intracranial injury. Vascular: No hyperdense vessel or unexpected calcification. Skull: No acute calvarial abnormality. Sinuses/Orbits: Visualized paranasal sinuses and mastoids clear. Orbital soft tissues unremarkable. Other: None IMPRESSION: Atrophy, chronic microvascular disease. No acute intracranial abnormality. Electronically Signed   By: Charlett Nose M.D.   On: 07/02/2018 08:30   Ct Angio Chest Pe W And/or Wo Contrast  Result Date: 07/02/2018 CLINICAL DATA:  History of pulmonary embolus.  Elevated troponin. EXAM: CT ANGIOGRAPHY CHEST WITH CONTRAST TECHNIQUE: Multidetector CT imaging of the chest was performed using the standard protocol during bolus administration of intravenous contrast. Multiplanar CT image reconstructions and MIPs were obtained to evaluate the vascular anatomy. CONTRAST:  75mL OMNIPAQUE IOHEXOL 350 MG/ML SOLN COMPARISON:  04/15/2018 FINDINGS: Cardiovascular: No filling defects in the pulmonary arteries to suggest pulmonary emboli. Heart is normal size. Aorta is normal caliber. Aortic atherosclerosis and coronary artery calcifications. Mediastinum/Nodes: No mediastinal, hilar, or axillary adenopathy. Lungs/Pleura: Advanced emphysema. No confluent opacities or effusions. Upper Abdomen: Imaging into the upper abdomen shows no acute findings. Musculoskeletal: Chest wall soft tissues are unremarkable. No acute bony abnormality. Review of the MIP images confirms the above findings. IMPRESSION: No evidence of pulmonary embolus. Coronary artery disease. Aortic Atherosclerosis (ICD10-I70.0) and Emphysema (ICD10-J43.9). Electronically Signed   By: Charlett Nose M.D.   On: 07/02/2018 09:13   Mr Brain Wo Contrast  Result Date: 07/02/2018 CLINICAL DATA:  Vertigo.  Unsteady gait.  Fall.  EXAM: MRI HEAD WITHOUT CONTRAST TECHNIQUE: Multiplanar, multiecho pulse sequences of the brain and surrounding structures were obtained without intravenous contrast. COMPARISON:  Head CT 07/02/2018 and MRI 06/20/2015 FINDINGS: Brain: There is no evidence of acute infarct, mass, midline shift, or extra-axial fluid collection. A chronic infarct is again seen in the left basal ganglia and corona radiata with associated chronic blood products. T2 hyperintensities elsewhere in the cerebral white matter bilaterally have at most slightly progressed from the prior MRI and are nonspecific but compatible with chronic small vessel ischemic disease, mild for age. There is moderate cerebral atrophy. Vascular: Major intracranial vascular flow voids are preserved. Skull and upper cervical spine: Unremarkable calvarial bone marrow signal. Advanced cervical disc degeneration. Sinuses/Orbits: Bilateral cataract extraction. Clear paranasal sinuses. Small left mastoid effusion. Other: None. IMPRESSION: 1. No acute intracranial abnormality. 2. Mild chronic small vessel ischemic disease. Electronically Signed   By: Sebastian Ache M.D.   On: 07/02/2018 12:15   US Carotid Bilateral  Result Date: 07/02/2018 CLINICAL DATA:  CVA. History of visual disturbance, syncopal episode, hypertension and diabetes. EXAM: BILATERAL CAROTID DUPLEX ULTRASOUND TECHNIQUE: Wallace Cullens scale imaging, color Doppler and duplex ultrasound were performed of bilateral carotid and vertebral arteries in the neck. COMPARISON:  None. FINDINGS: Criteria: Quantification of carotid stenosis is based on velocity parameters that correlate the residual internal carotid diameter with NASCET-based stenosis levels, using the diameter of the distal internal carotid lumen as the denominator for stenosis measurement. The following velocity measurements were obtained: RIGHT ICA: 75/25 cm/sec CCA: 54/8 cm/sec SYSTOLIC ICA/CCA RATIO:  1.4 ECA: 70 cm/sec LEFT ICA: 71/26 cm/sec CCA: 50/11  cm/sec SYSTOLIC ICA/CCA RATIO:  1.4 ECA: 51 cm/sec RIGHT CAROTID ARTERY: There is a moderate to large amount of eccentric  echogenic plaque within the right carotid bulb (image 14 and 16), extending to involve the origin and proximal aspects of the right internal carotid artery (image 24), not resulting in elevated peak systolic velocities within the interrogated course of the right internal carotid artery to suggest a hemodynamically significant stenosis. RIGHT VERTEBRAL ARTERY:  Antegrade flow LEFT CAROTID ARTERY: There is a minimal amount of eccentric echogenic plaque scattered throughout the left common carotid artery (images 36, 40 and 44). There is a moderate amount of eccentric echogenic plaque within the left carotid bulb (images 47 and 49), not resulting in elevated peak systolic velocities within the interrogated course of the left internal carotid artery to suggest a hemodynamically significant stenosis. LEFT VERTEBRAL ARTERY:  Antegrade Flow IMPRESSION: Moderate to large amount of bilateral atherosclerotic plaque, right greater than left, not resulting in a hemodynamically significant stenosis within either internal carotid artery. Electronically Signed   By: Simonne Come M.D.   On: 07/02/2018 11:35   Dg Chest Port 1 View  Result Date: 07/02/2018 CLINICAL DATA:  Cough EXAM: PORTABLE CHEST 1 VIEW COMPARISON:  04/14/2018 FINDINGS: Interstitial coarsening correlating with emphysema by CT. Patchy density at the right base that was also seen previously and without clear parenchymal correlate by CT. Normal heart size and stable mediastinal contours. IMPRESSION: 1. Stable from 04/14/2018.  No definite acute disease. 2. Emphysema. Electronically Signed   By: Marnee Spring M.D.   On: 07/02/2018 08:30    EKG: Orders placed or performed during the hospital encounter of 07/02/18  . ED EKG  . ED EKG    IMPRESSION AND PLAN:  *Elevated troponin Patient does not have complaints but troponin significantly  high without EKG changes. I would monitor on telemetry and start on heparin drip.  Follow serial troponins. Check lipid panel and hemoglobin A1c.  Check echocardiogram.  Get cardiology consult.  Dr. Vennie Homans is contacted by text messaging.  *Dizziness Because of patient's vague complaint and not compliant with Eliquis, I have ordered MRI of the brain, carotid Doppler and echocardiogram to rule out any stroke. We will check orthostatic hypotension.  *Hypertension Continue amlodipine, losartan  *Diabetes Continue glipizide and keep on sliding scale coverage.  All the records are reviewed and case discussed with ED provider. Management plans discussed with the patient, family and they are in agreement.  CODE STATUS: DNR    Code Status Orders  (From admission, onward)         Start     Ordered   07/02/18 1203  Do not attempt resuscitation (DNR)  Continuous    Question Answer Comment  In the event of cardiac or respiratory ARREST Do not call a "code blue"   In the event of cardiac or respiratory ARREST Do not perform Intubation, CPR, defibrillation or ACLS   In the event of cardiac or respiratory ARREST Use medication by any route, position, wound care, and other measures to relive pain and suffering. May use oxygen, suction and manual treatment of airway obstruction as needed for comfort.      07/02/18 1202        Code Status History    Date Active Date Inactive Code Status Order ID Comments User Context   04/15/2018 0425 04/16/2018 2039 Full Code [id number]  Arnaldo Natal, MD Inpatient   06/18/2015 1736 06/20/2015 1716 Full Code [id number]  Enid Baas, MD Inpatient       TOTAL TIME TAKING CARE OF THIS PATIENT: 50  Minutes.    Altamese Dilling M.D  on 07/02/2018   Between 7am to 6pm - Pager - [phone]  After 6pm go to [email or web address] - password [email or web address]  [facility]  Office  [phone]  CC: Primary care physician; [name], [name],  MD   Note: This dictation was prepared with Dragon dictation along with smaller phrase technology. Any transcriptional errors that result from this process are unintentional.

## 2018-07-02 NOTE — ED Notes (Signed)
Resumed care from ally rn.  Pt alert.  Pt waiting on admission bed.  Pt watching tv. Heparin infusing.

## 2018-07-02 NOTE — ED Notes (Signed)
Dr Juliann Pares in with pt

## 2018-07-03 DIAGNOSIS — R7989 Other specified abnormal findings of blood chemistry: Secondary | ICD-10-CM | POA: Diagnosis not present

## 2018-07-03 LAB — CBC
HCT: 41.6 % (ref 39.0–52.0)
Hemoglobin: 13 g/dL (ref 13.0–17.0)
MCH: 26 pg (ref 26.0–34.0)
MCHC: 31.3 g/dL (ref 30.0–36.0)
MCV: 83.2 fL (ref 80.0–100.0)
Platelets: 376 10*3/uL (ref 150–400)
RBC: 5 MIL/uL (ref 4.22–5.81)
RDW: 13.7 % (ref 11.5–15.5)
WBC: 5.8 10*3/uL (ref 4.0–10.5)
nRBC: 0 % (ref 0.0–0.2)

## 2018-07-03 LAB — APTT: aPTT: 76 seconds — ABNORMAL HIGH (ref 24–36)

## 2018-07-03 LAB — BASIC METABOLIC PANEL
Anion gap: 12 (ref 5–15)
BUN: 14 mg/dL (ref 8–23)
CO2: 21 mmol/L — ABNORMAL LOW (ref 22–32)
Calcium: 9 mg/dL (ref 8.9–10.3)
Chloride: 104 mmol/L (ref 98–111)
Creatinine, Ser: 1.05 mg/dL (ref 0.61–1.24)
GFR calc Af Amer: >60 mL/min (ref >60)
GFR calc non Af Amer: 59 mL/min — ABNORMAL LOW (ref >60)
Glucose, Bld: 207 mg/dL — ABNORMAL HIGH (ref 70–99)
Potassium: 3.8 mmol/L (ref 3.5–5.1)
Sodium: 137 mmol/L (ref 135–145)

## 2018-07-03 LAB — HEPARIN LEVEL (UNFRACTIONATED): Heparin Unfractionated: 0.3 IU/mL (ref 0.30–0.70)

## 2018-07-03 LAB — ECHOCARDIOGRAM COMPLETE
Height: 69 in
Weight: 2464 oz

## 2018-07-03 LAB — GLUCOSE, CAPILLARY
Glucose-Capillary: 151 mg/dL — ABNORMAL HIGH (ref 70–99)
Glucose-Capillary: 147 mg/dL — ABNORMAL HIGH (ref 70–99)

## 2018-07-03 MED ORDER — HALOPERIDOL LACTATE 5 MG/ML IJ SOLN
0.5000 mg | Freq: Four times a day (QID) | INTRAMUSCULAR | Status: DC | PRN
  Filled 2018-07-03: qty 0.1

## 2018-07-03 MED ORDER — PRAVASTATIN SODIUM 20 MG PO TABS: 20.0000 mg | ORAL_TABLET | Freq: Every day | ORAL | 0 refills | Status: AC

## 2018-07-03 MED ORDER — OMEPRAZOLE 20 MG PO CPDR: 40.0000 mg | DELAYED_RELEASE_CAPSULE | Freq: Every day | ORAL | 0 refills | Status: AC

## 2018-07-03 MED ORDER — ASPIRIN EC 81 MG PO TBEC: 81.0000 mg | DELAYED_RELEASE_TABLET | Freq: Every day | ORAL | 0 refills | Status: AC

## 2018-07-03 NOTE — ED Notes (Signed)
RN acting as Comptroller at bedside until sitter can be provided. Family has left room and left contact information.   Wife - Sedalia Muta - Cell [phone])  Home [phone])

## 2018-07-03 NOTE — ED Notes (Signed)
Pt resting comfortably at this time. Will continue to monitor.

## 2018-07-03 NOTE — ED Notes (Signed)
No hospital bed available for pt.

## 2018-07-03 NOTE — ED Notes (Signed)
Pt ambulated 30 yards without assistance. I walked along beside him, his gate was steady and well controlled.

## 2018-07-03 NOTE — ED Notes (Signed)
Sitting 1:1 with pt. 

## 2018-07-03 NOTE — Progress Notes (Signed)
Inpatient Diabetes Program Recommendations  AACE/ADA: New Consensus Statement on Inpatient Glycemic Control (2015)  Target Ranges:  Prepandial:   less than 140 mg/dL      Peak postprandial:   less than 180 mg/dL (1-2 hours)      Critically ill patients:  140 - 180 mg/dL   Lab Results  Component Value Date   GLUCAP 147 (H) 07/03/2018   HGBA1C 7.4 (H) 07/02/2018    Review of Glycemic Control  Diabetes history: DM Outpatient Diabetes medications: Glucotrol 5 mg qd Current orders for Inpatient glycemic control: Glucotrol 5 mg qd + Novolog correction sensitive tid + hs 0-5 units  Inpatient Diabetes Program Recommendations:   Patient currently in ED.A1c 7.4. Consider D/C of Glucotrol while in the hospital and may consider D/C as a home medication to prevent hypoglycemia.  Thank you, Billy Fischer. Kaisen Ackers, RN, MSN, CDE  Diabetes Coordinator Inpatient Glycemic Control Team Team Pager [phone] (8am-5pm) 07/03/2018 1:58 PM

## 2018-07-03 NOTE — ED Notes (Signed)
RN called wife without answer. Message left on answering machine.

## 2018-07-03 NOTE — ED Notes (Signed)
Cardiologist called this RN back and updated that he would have echo read as soon as possible.

## 2018-07-03 NOTE — ED Notes (Signed)
After short sleep, and apparently confused, pt grabbed and removed his IV from his L forearm. Arm was bandaged to control bleeding.

## 2018-07-03 NOTE — Discharge Summary (Signed)
[facility] [place] at [facility]   PATIENT NAME: Ruben Ruiz    MR#:  [id number]  DATE OF BIRTH:  03-09-1918  DATE OF ADMISSION:  07/02/2018 ADMITTING PHYSICIAN: No admitting provider for patient encounter.  DATE OF DISCHARGE: 07/03/2018   PRIMARY CARE PHYSICIAN: Hillery Aldo, MD    ADMISSION DIAGNOSIS:  unsteady gait  DISCHARGE DIAGNOSIS:  Active Problems:   Elevated troponin   SECONDARY DIAGNOSIS:   Past Medical History:  Diagnosis Date  . Dementia (HCC)   . Diabetes mellitus without complication (HCC)   . GERD (gastroesophageal reflux disease)   . HOH (hard of hearing)    AID  . Hypertension     HOSPITAL COURSE:   *Elevated troponin Patient does not have complaints but troponin significantly high without EKG changes. I would monitor on telemetry and start on heparin drip.  Follow serial troponins. Check lipid panel and hemoglobin A1c.  Check echocardiogram.  Get cardiology consult.  Dr. Belinda Block is contacted by text messaging. Due to elevated troponin he was started on heparin drip.  His serial troponin remains stable and started coming down.  Echocardiogram was without any significant abnormalities.  Cardiology suggested the rising troponin is stress-induced and no need for heparin drip.  No need for further cardiac work-up and advised to follow as outpatient in clinic in 1 to 2 weeks.  *Dizziness Because of patient's vague complaint and not compliant with Eliquis, I have ordered MRI of the brain, carotid Doppler and echocardiogram to rule out any stroke. His orthostatic vitals were stable.  His MRI was without any acute stroke.  His carotid Doppler study shows some blockages but no significant to require interventions.  *Hypertension Continue amlodipine, losartan  *Diabetes Continue glipizide and keep on sliding scale coverage.  Patient remained stable without any complaints so discharging home.  Spoke to his grandson on  phone and informed about the same.  DISCHARGE CONDITIONS:   Stable.  CONSULTS OBTAINED:  Treatment Team:  Alwyn Pea, MD  DRUG ALLERGIES:  No Known Allergies  DISCHARGE MEDICATIONS:   Allergies as of 07/03/2018   No Known Allergies     Medication List    TAKE these medications   amLODipine 5 MG tablet Commonly known as:  NORVASC Take 5 mg by mouth daily.   aspirin EC 81 MG tablet Take 1 tablet (81 mg total) by mouth daily.   docusate sodium 100 MG capsule Commonly known as:  COLACE Take 1 capsule (100 mg total) by mouth 2 (two) times daily.   ELIQUIS DVT/PE STARTER PACK 5 MG Tabs Take as directed on package: start with two-5mg  tablets twice daily for 7 days. On day 8, switch to one-5mg  tablet twice daily. What changed:    how much to take  how to take this  when to take this   glipiZIDE 5 MG tablet Commonly known as:  GLUCOTROL Take 1 tablet (5 mg total) by mouth daily.   levothyroxine 50 MCG tablet Commonly known as:  SYNTHROID, LEVOTHROID Take 1 tablet (50 mcg total) by mouth daily before breakfast.   losartan 100 MG tablet Commonly known as:  COZAAR Take 100 mg by mouth at bedtime.   omeprazole 20 MG capsule Commonly known as:  PRILOSEC Take 2 capsules (40 mg total) by mouth at bedtime.   pravastatin 20 MG tablet Commonly known as:  PRAVACHOL Take 1 tablet (20 mg total) by mouth at bedtime.   triamcinolone cream 0.1 % Commonly known as:  KENALOG  Apply 1 application topically 2 (two) times daily.        DISCHARGE INSTRUCTIONS:    Follow with cardiology clinic in 2 weeks.  If you experience worsening of your admission symptoms, develop shortness of breath, life threatening emergency, suicidal or homicidal thoughts you must seek medical attention immediately by calling 911 or calling your MD immediately  if symptoms less severe.  You Must read complete instructions/literature along with all the possible adverse reactions/side effects  for all the Medicines you take and that have been prescribed to you. Take any new Medicines after you have completely understood and accept all the possible adverse reactions/side effects.   Please note  You were cared for by a hospitalist during your hospital stay. If you have any questions about your discharge medications or the care you received while you were in the hospital after you are discharged, you can call the unit and asked to speak with the hospitalist on call if the hospitalist that took care of you is not available. Once you are discharged, your primary care physician will handle any further medical issues. Please note that NO REFILLS for any discharge medications will be authorized once you are discharged, as it is imperative that you return to your primary care physician (or establish a relationship with a primary care physician if you do not have one) for your aftercare needs so that they can reassess your need for medications and monitor your lab values.    Today   CHIEF COMPLAINT:   Chief Complaint  Patient presents with  . Gait Problem    HISTORY OF PRESENT ILLNESS:  Ruben Ruiz  is a 85 y.o. male with a known history of dementia, diabetes, gastroesophageal reflux disease, hypertension-lives at home and walks with a cane sometimes but not very reliable on his medications.  He was also on Eliquis for PE.  For last 1 to 2 days he has some dizziness and imbalance so came to hospital.  He is not a very good historian.  His wife was in the room during my visit but she also could not give a clear history but she said that patient has dementia and he does not take his medication reliably.\ Because of his weight complaints ER did multiple work-up including CT scan of the head, CT scan of chest with angiogram, troponin levels and lab reports which all of them where noncontributory except for very high troponin.  Patient did not had any chest Pain complaint but because of significant  chest pain, he advised to admit.   VITAL SIGNS:  Blood pressure (!) 172/98, pulse 98, temperature 98.2 F (36.8 C), temperature source Oral, resp. rate 16, height 5\' 9"  (1.753 m), weight 69.9 kg, SpO2 100 %.  I/O:  No intake or output data in the 24 hours ending 07/03/18 1544  PHYSICAL EXAMINATION:  GENERAL:  85 y.o.-year-old patient lying in the bed with no acute distress.  EYES: Pupils equal, round, reactive to light and accommodation. No scleral icterus. Extraocular muscles intact.  HEENT: Head atraumatic, normocephalic. Oropharynx and nasopharynx clear.  NECK:  Supple, no jugular venous distention. No thyroid enlargement, no tenderness.  LUNGS: Normal breath sounds bilaterally, no wheezing, rales,rhonchi or crepitation. No use of accessory muscles of respiration.  CARDIOVASCULAR: S1, S2 normal. No murmurs, rubs, or gallops.  ABDOMEN: Soft, non-tender, non-distended. Bowel sounds present. No organomegaly or mass.  EXTREMITIES: No pedal edema, cyanosis, or clubbing.  NEUROLOGIC: Cranial nerves II through XII are intact. Muscle strength 5/5  in all extremities. Sensation intact. Gait not checked.  PSYCHIATRIC: The patient is alert and oriented x 2  SKIN: No obvious rash, lesion, or ulcer.   DATA REVIEW:   CBC Recent Labs  Lab 07/03/18 0908  WBC 5.8  HGB 13.0  HCT 41.6  PLT 376    Chemistries  Recent Labs  Lab 07/02/18 0754 07/03/18 0908  NA 138 137  K 3.9 3.8  CL 103 104  CO2 23 21*  GLUCOSE 191* 207*  BUN 21 14  CREATININE 1.31* 1.05  CALCIUM 8.9 9.0  AST 22  --   ALT 12  --   ALKPHOS 73  --   BILITOT 1.2  --     Cardiac Enzymes Recent Labs  Lab 07/02/18 2309  TROPONINI 0.87*    Microbiology Results  No results found for this or any previous visit.  RADIOLOGY:  Ct Head Wo Contrast  Result Date: 07/02/2018 CLINICAL DATA:  Gait disturbance, fall EXAM: CT HEAD WITHOUT CONTRAST TECHNIQUE: Contiguous axial images were obtained from the base of the skull  through the vertex without intravenous contrast. COMPARISON:  12/11/2017 FINDINGS: Brain: Chronic lacunar infarct in the left periventricular white matter. There is atrophy and chronic small vessel disease changes. No acute intracranial abnormality. Specifically, no hemorrhage, hydrocephalus, mass lesion, acute infarction, or significant intracranial injury. Vascular: No hyperdense vessel or unexpected calcification. Skull: No acute calvarial abnormality. Sinuses/Orbits: Visualized paranasal sinuses and mastoids clear. Orbital soft tissues unremarkable. Other: None IMPRESSION: Atrophy, chronic microvascular disease. No acute intracranial abnormality. Electronically Signed   By: Charlett Nose M.D.   On: 07/02/2018 08:30   Ct Angio Chest Pe W And/or Wo Contrast  Result Date: 07/02/2018 CLINICAL DATA:  History of pulmonary embolus.  Elevated troponin. EXAM: CT ANGIOGRAPHY CHEST WITH CONTRAST TECHNIQUE: Multidetector CT imaging of the chest was performed using the standard protocol during bolus administration of intravenous contrast. Multiplanar CT image reconstructions and MIPs were obtained to evaluate the vascular anatomy. CONTRAST:  52mL OMNIPAQUE IOHEXOL 350 MG/ML SOLN COMPARISON:  04/15/2018 FINDINGS: Cardiovascular: No filling defects in the pulmonary arteries to suggest pulmonary emboli. Heart is normal size. Aorta is normal caliber. Aortic atherosclerosis and coronary artery calcifications. Mediastinum/Nodes: No mediastinal, hilar, or axillary adenopathy. Lungs/Pleura: Advanced emphysema. No confluent opacities or effusions. Upper Abdomen: Imaging into the upper abdomen shows no acute findings. Musculoskeletal: Chest wall soft tissues are unremarkable. No acute bony abnormality. Review of the MIP images confirms the above findings. IMPRESSION: No evidence of pulmonary embolus. Coronary artery disease. Aortic Atherosclerosis (ICD10-I70.0) and Emphysema (ICD10-J43.9). Electronically Signed   By: Charlett Nose  M.D.   On: 07/02/2018 09:13   Mr Brain Wo Contrast  Result Date: 07/02/2018 CLINICAL DATA:  Vertigo.  Unsteady gait.  Fall. EXAM: MRI HEAD WITHOUT CONTRAST TECHNIQUE: Multiplanar, multiecho pulse sequences of the brain and surrounding structures were obtained without intravenous contrast. COMPARISON:  Head CT 07/02/2018 and MRI 06/20/2015 FINDINGS: Brain: There is no evidence of acute infarct, mass, midline shift, or extra-axial fluid collection. A chronic infarct is again seen in the left basal ganglia and corona radiata with associated chronic blood products. T2 hyperintensities elsewhere in the cerebral white matter bilaterally have at most slightly progressed from the prior MRI and are nonspecific but compatible with chronic small vessel ischemic disease, mild for age. There is moderate cerebral atrophy. Vascular: Major intracranial vascular flow voids are preserved. Skull and upper cervical spine: Unremarkable calvarial bone marrow signal. Advanced cervical disc degeneration. Sinuses/Orbits: Bilateral cataract extraction. Clear  paranasal sinuses. Small left mastoid effusion. Other: None. IMPRESSION: 1. No acute intracranial abnormality. 2. Mild chronic small vessel ischemic disease. Electronically Signed   By: Sebastian Ache M.D.   On: 07/02/2018 12:15   US Carotid Bilateral  Result Date: 07/02/2018 CLINICAL DATA:  CVA. History of visual disturbance, syncopal episode, hypertension and diabetes. EXAM: BILATERAL CAROTID DUPLEX ULTRASOUND TECHNIQUE: Wallace Cullens scale imaging, color Doppler and duplex ultrasound were performed of bilateral carotid and vertebral arteries in the neck. COMPARISON:  None. FINDINGS: Criteria: Quantification of carotid stenosis is based on velocity parameters that correlate the residual internal carotid diameter with NASCET-based stenosis levels, using the diameter of the distal internal carotid lumen as the denominator for stenosis measurement. The following velocity measurements were  obtained: RIGHT ICA: 75/25 cm/sec CCA: 54/8 cm/sec SYSTOLIC ICA/CCA RATIO:  1.4 ECA: 70 cm/sec LEFT ICA: 71/26 cm/sec CCA: 50/11 cm/sec SYSTOLIC ICA/CCA RATIO:  1.4 ECA: 51 cm/sec RIGHT CAROTID ARTERY: There is a moderate to large amount of eccentric echogenic plaque within the right carotid bulb (image 14 and 16), extending to involve the origin and proximal aspects of the right internal carotid artery (image 24), not resulting in elevated peak systolic velocities within the interrogated course of the right internal carotid artery to suggest a hemodynamically significant stenosis. RIGHT VERTEBRAL ARTERY:  Antegrade flow LEFT CAROTID ARTERY: There is a minimal amount of eccentric echogenic plaque scattered throughout the left common carotid artery (images 36, 40 and 44). There is a moderate amount of eccentric echogenic plaque within the left carotid bulb (images 47 and 49), not resulting in elevated peak systolic velocities within the interrogated course of the left internal carotid artery to suggest a hemodynamically significant stenosis. LEFT VERTEBRAL ARTERY:  Antegrade Flow IMPRESSION: Moderate to large amount of bilateral atherosclerotic plaque, right greater than left, not resulting in a hemodynamically significant stenosis within either internal carotid artery. Electronically Signed   By: Simonne Come M.D.   On: 07/02/2018 11:35   Dg Chest Port 1 View  Result Date: 07/02/2018 CLINICAL DATA:  Cough EXAM: PORTABLE CHEST 1 VIEW COMPARISON:  04/14/2018 FINDINGS: Interstitial coarsening correlating with emphysema by CT. Patchy density at the right base that was also seen previously and without clear parenchymal correlate by CT. Normal heart size and stable mediastinal contours. IMPRESSION: 1. Stable from 04/14/2018.  No definite acute disease. 2. Emphysema. Electronically Signed   By: Marnee Spring M.D.   On: 07/02/2018 08:30    EKG:   Orders placed or performed during the hospital encounter of 07/02/18   . ED EKG  . ED EKG      Management plans discussed with the patient, family and they are in agreement.  CODE STATUS:     Code Status Orders  (From admission, onward)         Start     Ordered   07/02/18 1203  Do not attempt resuscitation (DNR)  Continuous    Question Answer Comment  In the event of cardiac or respiratory ARREST Do not call a "code blue"   In the event of cardiac or respiratory ARREST Do not perform Intubation, CPR, defibrillation or ACLS   In the event of cardiac or respiratory ARREST Use medication by any route, position, wound care, and other measures to relive pain and suffering. May use oxygen, suction and manual treatment of airway obstruction as needed for comfort.      07/02/18 1202        Code Status History  Date Active Date Inactive Code Status Order ID Comments User Context   04/15/2018 0425 04/16/2018 2039 Full Code [id number]  Arnaldo Natal, MD Inpatient   06/18/2015 1736 06/20/2015 1716 Full Code [id number]  Enid Baas, MD Inpatient      TOTAL TIME TAKING CARE OF THIS PATIENT: 35 minutes.    Altamese Dilling M.D on 07/03/2018 at 3:44 PM  Between 7am to 6pm - Pager - [phone]  After 6pm go to [email or web address] - password [email or web address]  [facility]  Office  [phone]  CC: Primary care physician; Hillery Aldo, MD   Note: This dictation was prepared with Dragon dictation along with smaller phrase technology. Any transcriptional errors that result from this process are unintentional.

## 2018-07-03 NOTE — Progress Notes (Signed)
ANTICOAGULATION CONSULT NOTE   Pharmacy Consult for Heparin Indication: chest pain/ACS  No Known Allergies  Patient Measurements: Height: 5\' 9"  (175.3 cm) Weight: 154 lb (69.9 kg) IBW/kg (Calculated) : 70.7 Heparin Dosing Weight: 69.9 kg  Vital Signs: BP: 160/95 (03/04 1000) Pulse Rate: 89 (03/04 1000)  Labs: Recent Labs    07/02/18 0754 07/02/18 1158 07/02/18 1616 07/02/18 2309 07/03/18 0908  HGB 13.7  --   --   --  13.0  HCT 43.9  --   --   --  41.6  PLT 375  --   --   --  376  APTT 35  --   --  97* 76*  LABPROT 14.9  --   --   --   --   INR 1.2  --   --   --   --   HEPARINUNFRC  --   --  0.58  --  0.30  CREATININE 1.31*  --   --   --  1.05  TROPONINI 0.87* 1.01* 0.63* 0.87*  --     Estimated Creatinine Clearance: 38.8 mL/min (by C-G formula based on SCr of 1.05 mg/dL).   Medical History: Past Medical History:  Diagnosis Date  . Dementia (HCC)   . Diabetes mellitus without complication (HCC)   . GERD (gastroesophageal reflux disease)   . HOH (hard of hearing)    AID  . Hypertension     Medications:  (Not in a hospital admission)  Scheduled:  . amLODipine  5 mg Oral Daily  . aspirin EC  81 mg Oral Daily  . docusate sodium  100 mg Oral BID  . glipiZIDE  5 mg Oral Daily  . insulin aspart  0-5 Units Subcutaneous QHS  . insulin aspart  0-9 Units Subcutaneous TID WC  . levothyroxine  50 mcg Oral QAC breakfast  . losartan  100 mg Oral QHS  . pantoprazole  40 mg Oral Daily  . pravastatin  20 mg Oral QHS   Infusions:  . heparin 800 Units/hr (07/02/18 1432)   PRN: docusate sodium  Assessment: Patient on apixaban for previous VTE. Pharmacy consulted for heparin dosing for elevated troponin. Per H&P patietn does not take medication reliably,     Give 4000 units bolus x 1 Start heparin infusion at 8000 units/hr Check aPTT level in 8 hours and daily while on heparin Continue to monitor H&H and platelets  3/3 PM aPTT 97. Continue current regimen.  Recheck aPTT, heparin level, and CBC with tomorrow AM labs.  Goal of Therapy:  APTT goal of 66-102 seconds.  Heparin level 0.3-0.7 units/ml when aPTT and heparin level correlated  Monitor platelets by anticoagulation protocol: Yes   Plan:  3/4 0908 aptt= 76, HL= 0.30. These are both therapeutic and appear to correlate therefore will transition to just checking HL levels. Will check confirmatory HL level in 8 hours.  Aayush Gelpi A, PharmD, BCPS 07/03/2018,10:34 AM

## 2018-07-03 NOTE — ED Notes (Signed)
RN called Echo Tech who informed RN pts echo was performed yesterday at 14:00. Echo remains unread at this time. This RN paged Cardiologist at [phone]

## 2018-07-03 NOTE — ED Notes (Signed)
Report off to andrea rn  

## 2018-07-03 NOTE — Progress Notes (Signed)
ANTICOAGULATION CONSULT NOTE - Initial Consult  Pharmacy Consult for Heparin Indication: chest pain/ACS  No Known Allergies  Patient Measurements: Height: 5\' 9"  (175.3 cm) Weight: 154 lb (69.9 kg) IBW/kg (Calculated) : 70.7 Heparin Dosing Weight: 69.9 kg  Vital Signs: Temp: 98.2 F (36.8 C) (03/03 1725) Temp Source: Oral (03/03 1725) BP: 130/89 (03/04 0230) Pulse Rate: 93 (03/04 0215)  Labs: Recent Labs    07/02/18 0754 07/02/18 1158 07/02/18 1616 07/02/18 2309  HGB 13.7  --   --   --   HCT 43.9  --   --   --   PLT 375  --   --   --   APTT 35  --   --  97*  LABPROT 14.9  --   --   --   INR 1.2  --   --   --   HEPARINUNFRC  --   --  0.58  --   CREATININE 1.31*  --   --   --   TROPONINI 0.87* 1.01* 0.63* 0.87*    Estimated Creatinine Clearance: 31.1 mL/min (A) (by C-G formula based on SCr of 1.31 mg/dL (H)).   Medical History: Past Medical History:  Diagnosis Date  . Dementia (HCC)   . Diabetes mellitus without complication (HCC)   . GERD (gastroesophageal reflux disease)   . HOH (hard of hearing)    AID  . Hypertension     Medications:  (Not in a hospital admission)  Scheduled:  . amLODipine  5 mg Oral Daily  . aspirin EC  81 mg Oral Daily  . docusate sodium  100 mg Oral BID  . glipiZIDE  5 mg Oral Daily  . insulin aspart  0-5 Units Subcutaneous QHS  . insulin aspart  0-9 Units Subcutaneous TID WC  . levothyroxine  50 mcg Oral QAC breakfast  . losartan  100 mg Oral QHS  . pantoprazole  40 mg Oral Daily  . pravastatin  20 mg Oral QHS   Infusions:  . heparin 800 Units/hr (07/02/18 1432)   PRN: docusate sodium  Assessment: Patient on apixaban for previous VTE. Pharmacy consulted for heparin dosing for elevated troponin.    Goal of Therapy:  APTT goal of 66-102 seconds.  Heparin level 0.3-0.7 units/ml when aPTT and heparin level correlated  Monitor platelets by anticoagulation protocol: Yes   Plan:  Give 4000 units bolus x 1 Start heparin  infusion at 8000 units/hr Check aPTT level in 8 hours and daily while on heparin Continue to monitor H&H and platelets   3/3 PM aPTT 97. Continue current regimen. Recheck aPTT, heparin level, and CBC with tomorrow AM labs.  Ranier Coach S, PharmD, BCPS 07/03/2018,3:06 AM

## 2018-07-03 NOTE — ED Notes (Signed)
Pt repositioned in bed.

## 2018-07-03 NOTE — ED Notes (Signed)
Admitting MD at bedside.

## 2018-07-03 NOTE — ED Notes (Signed)
Two warm blankets placed on pt.

## 2018-07-03 NOTE — ED Notes (Signed)
Pt pulled out iv.  Iv restarted in left forearm.  meds infusing.

## 2018-07-03 NOTE — ED Notes (Signed)
PT reporting to this RN that an animal ran across the floor. Pt unable to describe the animal but there is nothing noted per this RN.

## 2018-07-03 NOTE — ED Notes (Signed)
Pt calm in room eating lunch with sitter. No need for haldol medication at this time. Admitting MD aware that medication will be held.

## 2018-07-03 NOTE — Discharge Instructions (Signed)
Follow with cardiologist in 2 weeks.

## 2018-07-03 NOTE — ED Notes (Signed)
Pt continues to wait on bed assignment   Pt sleeping  nsr on monitor.

## 2018-07-03 NOTE — ED Notes (Addendum)
Soft mitts removed per Ruben Ruiz, Consulting civil engineer. Pt messing with iv tubing. When asked what he was doing, pt stated "I am trying to get this out of my way." Assured pt that it was not in the way and that I could place tubing on side. Pt stated he "just wanted to wrap it around my neck so it would not get in the way." Explained to pt that it was not safe for him to wrap anything around his neck and that I am here and will move the tubing if it is in his way. Pt calm at this time. Will continue to monitor.

## 2018-07-03 NOTE — ED Notes (Signed)
Pt provided breakfast tray, declines at this time.

## 2018-07-03 NOTE — ED Notes (Signed)
Second message left on cell phone with same message to call the hospital back.

## 2018-07-03 NOTE — ED Notes (Addendum)
Soft mitts placed on patient due to pt pulling out iv and safety sitter placed at bedside. Tolerating well. provided for pt safety and comfort and will continue to assess.

## 2018-07-03 NOTE — ED Notes (Addendum)
Attempted to remove soft mitts from pt but he immediately started to reach for iv and attempted to pull at tubing. Cap refill <3sec and skin warm to upper extremities. Safety sitter remains at bedside. Provided for safety and comfort. Will continue to assess.

## 2018-07-03 NOTE — ED Notes (Signed)
Pt ate half of lunch and of water.

## 2018-07-05 ENCOUNTER — Emergency Department: Payer: Medicare Other

## 2018-07-05 ENCOUNTER — Other Ambulatory Visit: Payer: Self-pay

## 2018-07-05 ENCOUNTER — Encounter: Payer: Self-pay | Admitting: Emergency Medicine

## 2018-07-05 ENCOUNTER — Emergency Department
Admission: EM | Admit: 2018-07-05 | Discharge: 2018-07-05 | Disposition: A | Discharge status: Home / Self Care | Payer: Medicare Other | Attending: Emergency Medicine | Admitting: Emergency Medicine

## 2018-07-05 DIAGNOSIS — R0602 Shortness of breath: Secondary | ICD-10-CM | POA: Diagnosis present

## 2018-07-05 DIAGNOSIS — I10 Essential (primary) hypertension: Secondary | ICD-10-CM | POA: Diagnosis not present

## 2018-07-05 DIAGNOSIS — Z7982 Long term (current) use of aspirin: Secondary | ICD-10-CM | POA: Diagnosis not present

## 2018-07-05 DIAGNOSIS — J40 Bronchitis, not specified as acute or chronic: Secondary | ICD-10-CM

## 2018-07-05 DIAGNOSIS — Z79899 Other long term (current) drug therapy: Secondary | ICD-10-CM | POA: Diagnosis not present

## 2018-07-05 DIAGNOSIS — Z87891 Personal history of nicotine dependence: Secondary | ICD-10-CM | POA: Diagnosis not present

## 2018-07-05 DIAGNOSIS — E119 Type 2 diabetes mellitus without complications: Secondary | ICD-10-CM | POA: Insufficient documentation

## 2018-07-05 LAB — CBC WITH DIFFERENTIAL/PLATELET
Abs Immature Granulocytes: 0.03 K/uL (ref 0.00–0.07)
Basophils Absolute: 0.1 K/uL (ref 0.0–0.1)
Basophils Relative: 1 %
Eosinophils Absolute: 0.3 K/uL (ref 0.0–0.5)
Eosinophils Relative: 3 %
HCT: 45.1 % (ref 39.0–52.0)
Hemoglobin: 14.1 g/dL (ref 13.0–17.0)
Immature Granulocytes: 0 %
Lymphocytes Relative: 15 %
Lymphs Abs: 1.3 K/uL (ref 0.7–4.0)
MCH: 26 pg (ref 26.0–34.0)
MCHC: 31.3 g/dL (ref 30.0–36.0)
MCV: 83.2 fL (ref 80.0–100.0)
Monocytes Absolute: 0.9 K/uL (ref 0.1–1.0)
Monocytes Relative: 11 %
Neutro Abs: 5.8 K/uL (ref 1.7–7.7)
Neutrophils Relative %: 70 %
Platelets: 329 K/uL (ref 150–400)
RBC: 5.42 MIL/uL (ref 4.22–5.81)
RDW: 14 % (ref 11.5–15.5)
WBC: 8.3 K/uL (ref 4.0–10.5)
nRBC: 0 % (ref 0.0–0.2)

## 2018-07-05 LAB — BASIC METABOLIC PANEL
ANION GAP: 12 (ref 5–15)
BUN: 20 mg/dL (ref 8–23)
CHLORIDE: 103 mmol/L (ref 98–111)
CO2: 24 mmol/L (ref 22–32)
Calcium: 9.5 mg/dL (ref 8.9–10.3)
Creatinine, Ser: 1.49 mg/dL — ABNORMAL HIGH (ref 0.61–1.24)
GFR calc Af Amer: 45 mL/min — ABNORMAL LOW (ref >60)
GFR calc non Af Amer: 39 mL/min — ABNORMAL LOW (ref >60)
GLUCOSE: 203 mg/dL — AB (ref 70–99)
Potassium: 3.7 mmol/L (ref 3.5–5.1)
Sodium: 139 mmol/L (ref 135–145)

## 2018-07-05 NOTE — ED Provider Notes (Signed)
[facility] Emergency Department Provider Note   ____________________________________________    I have reviewed the triage vital signs and the nursing notes.   HISTORY  Chief Complaint Shortness of Breath     HPI Ruben Ruiz is a 85 y.o. male who presents with complaints of cough.  Patient recently discharged from the hospital, apparently home health nurse today was listening to his lungs and was concerned about a pneumonia.  Patient reports he feels at baseline.  No shortness of breath although he does report that he has a mild cough.  No fevers or chills reported.  No chest pain.  Past Medical History:  Diagnosis Date  . Dementia (HCC)   . Diabetes mellitus without complication (HCC)   . GERD (gastroesophageal reflux disease)   . HOH (hard of hearing)    AID  . Hypertension     Patient Active Problem List   Diagnosis Date Noted  . Elevated troponin 07/02/2018  . AKI (acute kidney injury) (HCC) 04/15/2018  . Hypertensive urgency 06/20/2015  . Hypoglycemia associated with diabetes (HCC) 06/20/2015  . Anemia 06/20/2015  . Syncope 06/18/2015    Past Surgical History:  Procedure Laterality Date  . CATARACT EXTRACTION W/PHACO Left 08/29/2017   Procedure: CATARACT EXTRACTION PHACO AND INTRAOCULAR LENS PLACEMENT (IOC);  Surgeon: Lockie Mola, MD;  Location: [facility] ORS;  Service: Ophthalmology;  Laterality: Left;  [place] 01:45.0 AP% 31.2 CDE 24.15 Fluid Pack Lot # [id number]  . EYE SURGERY      Prior to Admission medications   Medication Sig Start Date End Date Taking? Authorizing Provider  amLODipine (NORVASC) 5 MG tablet Take 5 mg by mouth daily. 09/29/16   [provider]  aspirin EC 81 MG tablet Take 1 tablet (81 mg total) by mouth daily. 07/03/18   Altamese Dilling, MD  docusate sodium (COLACE) 100 MG capsule Take 1 capsule (100 mg total) by mouth 2 (two) times daily. 04/16/18   Enid Baas, MD  ELIQUIS STARTER PACK  (ELIQUIS STARTER PACK) 5 MG TABS Take as directed on package: start with two-5mg  tablets twice daily for 7 days. On day 8, switch to one-5mg  tablet twice daily. Patient taking differently: Take 5 mg by mouth 2 (two) times daily. Take as directed on package: start with two-5mg  tablets twice daily for 7 days. On day 8, switch to one-5mg  tablet twice daily. 04/16/18   Enid Baas, MD  glipiZIDE (GLUCOTROL) 5 MG tablet Take 1 tablet (5 mg total) by mouth daily. 04/16/18 04/16/19  Enid Baas, MD  levothyroxine (SYNTHROID, LEVOTHROID) 50 MCG tablet Take 1 tablet (50 mcg total) by mouth daily before breakfast. 04/17/18   Enid Baas, MD  losartan (COZAAR) 100 MG tablet Take 100 mg by mouth at bedtime.     [provider]  omeprazole (PRILOSEC) 20 MG capsule Take 2 capsules (40 mg total) by mouth at bedtime. 07/03/18   Altamese Dilling, MD  pravastatin (PRAVACHOL) 20 MG tablet Take 1 tablet (20 mg total) by mouth at bedtime. 07/03/18   Altamese Dilling, MD  triamcinolone cream (KENALOG) 0.1 % Apply 1 application topically 2 (two) times daily.    [provider]     Allergies Patient has no known allergies.  No family history on file.  Social History Social History   Tobacco Use  . Smoking status: Former Games developer  . Smokeless tobacco: Never Used  Substance Use Topics  . Alcohol use: No  . Drug use: Never    Review of Systems  Constitutional: No fever/chills Eyes: No visual changes.  ENT: No sore throat. Cardiovascular: Denies chest pain. Respiratory: As above Gastrointestinal: No abdominal pain.  No nausea, no vomiting.   Genitourinary: Negative for dysuria. Musculoskeletal: Negative for back pain. Skin: Negative for rash. Neurological: Negative for headaches    ____________________________________________   PHYSICAL EXAM:  VITAL SIGNS: ED Triage Vitals  Enc Vitals Group     BP 07/05/18 1214 112/63     Pulse Rate 07/05/18 1214 99       Resp 07/05/18 1214 16     Temp 07/05/18 1214 (!) 97.4 F (36.3 C)     Temp Source 07/05/18 1214 Oral     SpO2 07/05/18 1214 100 %     Weight 07/05/18 1216 69.8 kg (153 lb 14.1 oz)     Height 07/05/18 1216 1.753 m (5\' 9" )     Head Circumference --      Peak Flow --      Pain Score 07/05/18 1216 0     Pain Loc --      Pain Edu? --      Excl. in GC? --     Constitutional: Alert and oriented.  Eyes: Conjunctivae are normal.   Nose: No congestion/rhinnorhea. Mouth/Throat: Mucous membranes are moist.    Cardiovascular: Normal rate, regular rhythm. Grossly normal heart sounds.  Good peripheral circulation. Respiratory: Normal respiratory effort.  No retractions. Lungs CTAB. Gastrointestinal: Soft and nontender. No distention.    Musculoskeletal:   Warm and well perfused Neurologic:  Normal speech and language. No gross focal neurologic deficits are appreciated.  Skin:  Skin is warm, dry and intact. No rash noted. Psychiatric: Mood and affect are normal. Speech and behavior are normal.  ____________________________________________   LABS (all labs ordered are listed, but only abnormal results are displayed)  Labs Reviewed  BASIC METABOLIC PANEL - Abnormal; Notable for the following components:      Result Value   Glucose, Bld 203 (*)    Creatinine, Ser 1.49 (*)    GFR calc non Af Amer 39 (*)    GFR calc Af Amer 45 (*)    All other components within normal limits  CBC WITH DIFFERENTIAL/PLATELET   ____________________________________________  EKG  ED ECG REPORT I, Jene Every, the attending physician, personally viewed and interpreted this ECG.  Date: 07/05/2018  Rhythm: normal sinus rhythm QRS Axis: normal Intervals: normal ST/T Wave abnormalities: normal Narrative Interpretation: no evidence of acute ischemia  ____________________________________________  RADIOLOGY  Chest x-ray  unremarkable ____________________________________________   PROCEDURES  Procedure(s) performed: No  Procedures   Critical Care performed: No ____________________________________________   INITIAL IMPRESSION / ASSESSMENT AND PLAN / ED COURSE  Pertinent labs & imaging results that were available during my care of the patient were reviewed by me and considered in my medical decision making (see chart for details).  Patient well-appearing and in no acute distress, recent hospitalization however lab work is quite reassuring, chest x-ray unremarkable and the patient is quite comfortable and seems to be at his baseline.  Discussed with his wife and she feels that he is also at his baseline, no indication for readmission at this time.  Patient follow-up with PCP, strict return precautions    ____________________________________________   FINAL CLINICAL IMPRESSION(S) / ED DIAGNOSES  Final diagnoses:  Bronchitis        Note:  This document was prepared using Dragon voice recognition software and may include unintentional dictation errors.   Jene Every, MD 07/05/18 [phone]

## 2018-07-05 NOTE — ED Triage Notes (Signed)
Pt to ED via POV c/o cough, congestion, and shortness of breath. Per family that is with pt, he was seen in the ED and admitted but was held in the ED and discharged from here. Family states that home health nurse came out today and told them he had pneumonia and needed to come back to the hospital. Pt is in NAD at this time. Pt is able to speak in complete sentences. No distress noted at this time.

## 2018-12-09 ENCOUNTER — Other Ambulatory Visit: Payer: Self-pay

## 2018-12-09 ENCOUNTER — Inpatient Hospital Stay
Admission: EM | Admit: 2018-12-09 | Discharge: 2018-12-16 | DRG: 177 | Disposition: A | Discharge status: Hospice | Payer: Medicare Other | Attending: Internal Medicine | Admitting: Internal Medicine

## 2018-12-09 ENCOUNTER — Emergency Department: Payer: Medicare Other

## 2018-12-09 ENCOUNTER — Inpatient Hospital Stay: Payer: Medicare Other

## 2018-12-09 DIAGNOSIS — E039 Hypothyroidism, unspecified: Secondary | ICD-10-CM | POA: Diagnosis present

## 2018-12-09 DIAGNOSIS — Z87891 Personal history of nicotine dependence: Secondary | ICD-10-CM

## 2018-12-09 DIAGNOSIS — K219 Gastro-esophageal reflux disease without esophagitis: Secondary | ICD-10-CM | POA: Diagnosis present

## 2018-12-09 DIAGNOSIS — Z20828 Contact with and (suspected) exposure to other viral communicable diseases: Secondary | ICD-10-CM | POA: Diagnosis present

## 2018-12-09 DIAGNOSIS — Z86711 Personal history of pulmonary embolism: Secondary | ICD-10-CM | POA: Diagnosis not present

## 2018-12-09 DIAGNOSIS — Z66 Do not resuscitate: Secondary | ICD-10-CM | POA: Diagnosis present

## 2018-12-09 DIAGNOSIS — Z7984 Long term (current) use of oral hypoglycemic drugs: Secondary | ICD-10-CM | POA: Diagnosis not present

## 2018-12-09 DIAGNOSIS — Z79899 Other long term (current) drug therapy: Secondary | ICD-10-CM

## 2018-12-09 DIAGNOSIS — G9341 Metabolic encephalopathy: Secondary | ICD-10-CM | POA: Diagnosis present

## 2018-12-09 DIAGNOSIS — J9601 Acute respiratory failure with hypoxia: Secondary | ICD-10-CM | POA: Diagnosis present

## 2018-12-09 DIAGNOSIS — R0602 Shortness of breath: Secondary | ICD-10-CM

## 2018-12-09 DIAGNOSIS — Z79890 Hormone replacement therapy (postmenopausal): Secondary | ICD-10-CM

## 2018-12-09 DIAGNOSIS — N179 Acute kidney failure, unspecified: Secondary | ICD-10-CM | POA: Diagnosis present

## 2018-12-09 DIAGNOSIS — H9190 Unspecified hearing loss, unspecified ear: Secondary | ICD-10-CM | POA: Diagnosis present

## 2018-12-09 DIAGNOSIS — Z7901 Long term (current) use of anticoagulants: Secondary | ICD-10-CM

## 2018-12-09 DIAGNOSIS — E785 Hyperlipidemia, unspecified: Secondary | ICD-10-CM | POA: Diagnosis present

## 2018-12-09 DIAGNOSIS — Z7952 Long term (current) use of systemic steroids: Secondary | ICD-10-CM

## 2018-12-09 DIAGNOSIS — I10 Essential (primary) hypertension: Secondary | ICD-10-CM | POA: Diagnosis present

## 2018-12-09 DIAGNOSIS — F0280 Dementia in other diseases classified elsewhere without behavioral disturbance: Secondary | ICD-10-CM | POA: Diagnosis present

## 2018-12-09 DIAGNOSIS — J9602 Acute respiratory failure with hypercapnia: Secondary | ICD-10-CM | POA: Diagnosis present

## 2018-12-09 DIAGNOSIS — J690 Pneumonitis due to inhalation of food and vomit: Principal | ICD-10-CM | POA: Diagnosis present

## 2018-12-09 DIAGNOSIS — E119 Type 2 diabetes mellitus without complications: Secondary | ICD-10-CM | POA: Diagnosis present

## 2018-12-09 DIAGNOSIS — Z7982 Long term (current) use of aspirin: Secondary | ICD-10-CM

## 2018-12-09 DIAGNOSIS — J189 Pneumonia, unspecified organism: Secondary | ICD-10-CM | POA: Diagnosis present

## 2018-12-09 LAB — CBC WITH DIFFERENTIAL/PLATELET
Abs Immature Granulocytes: 0.03 10*3/uL (ref 0.00–0.07)
Basophils Absolute: 0 10*3/uL (ref 0.0–0.1)
Basophils Relative: 1 %
Eosinophils Absolute: 0 10*3/uL (ref 0.0–0.5)
Eosinophils Relative: 0 %
HCT: 40.2 % (ref 39.0–52.0)
Hemoglobin: 12.3 g/dL — ABNORMAL LOW (ref 13.0–17.0)
Immature Granulocytes: 0 %
Lymphocytes Relative: 7 %
Lymphs Abs: 0.5 10*3/uL — ABNORMAL LOW (ref 0.7–4.0)
MCH: 25 pg — ABNORMAL LOW (ref 26.0–34.0)
MCHC: 30.6 g/dL (ref 30.0–36.0)
MCV: 81.7 fL (ref 80.0–100.0)
Monocytes Absolute: 0.6 10*3/uL (ref 0.1–1.0)
Monocytes Relative: 9 %
Neutro Abs: 6.1 10*3/uL (ref 1.7–7.7)
Neutrophils Relative %: 83 %
Platelets: 118 10*3/uL — ABNORMAL LOW (ref 150–400)
RBC: 4.92 MIL/uL (ref 4.22–5.81)
RDW: 16.6 % — ABNORMAL HIGH (ref 11.5–15.5)
WBC: 7.3 10*3/uL (ref 4.0–10.5)
nRBC: 0 % (ref 0.0–0.2)

## 2018-12-09 LAB — URINALYSIS, COMPLETE (UACMP) WITH MICROSCOPIC
Bacteria, UA: NONE SEEN
Bilirubin Urine: NEGATIVE
Glucose, UA: >=500 mg/dL — AB
Ketones, ur: NEGATIVE mg/dL
Leukocytes,Ua: NEGATIVE
Nitrite: NEGATIVE
Protein, ur: 100 mg/dL — AB
Specific Gravity, Urine: 1.025 (ref 1.005–1.030)
pH: 5 (ref 5.0–8.0)

## 2018-12-09 LAB — COMPREHENSIVE METABOLIC PANEL
ALT: 30 U/L (ref 0–44)
AST: 35 U/L (ref 15–41)
Albumin: 3.6 g/dL (ref 3.5–5.0)
Alkaline Phosphatase: 140 U/L — ABNORMAL HIGH (ref 38–126)
Anion gap: 13 (ref 5–15)
BUN: 34 mg/dL — ABNORMAL HIGH (ref 8–23)
CO2: 27 mmol/L (ref 22–32)
Calcium: 9.3 mg/dL (ref 8.9–10.3)
Chloride: 102 mmol/L (ref 98–111)
Creatinine, Ser: 1.85 mg/dL — ABNORMAL HIGH (ref 0.61–1.24)
GFR calc Af Amer: 34 mL/min — ABNORMAL LOW (ref >60)
GFR calc non Af Amer: 29 mL/min — ABNORMAL LOW (ref >60)
Glucose, Bld: 440 mg/dL — ABNORMAL HIGH (ref 70–99)
Potassium: 4.7 mmol/L (ref 3.5–5.1)
Sodium: 142 mmol/L (ref 135–145)
Total Bilirubin: 1.9 mg/dL — ABNORMAL HIGH (ref 0.3–1.2)
Total Protein: 7 g/dL (ref 6.5–8.1)

## 2018-12-09 LAB — GLUCOSE, CAPILLARY
Glucose-Capillary: 364 mg/dL — ABNORMAL HIGH (ref 70–99)
Glucose-Capillary: 437 mg/dL — ABNORMAL HIGH (ref 70–99)
Glucose-Capillary: 340 mg/dL — ABNORMAL HIGH (ref 70–99)

## 2018-12-09 LAB — BLOOD GAS, ARTERIAL
Acid-Base Excess: 9.7 mmol/L — ABNORMAL HIGH (ref 0.0–2.0)
Bicarbonate: 37.6 mmol/L — ABNORMAL HIGH (ref 20.0–28.0)
FIO2: 0.36
O2 Saturation: 91.4 %
Patient temperature: 37
pCO2 arterial: 65 mmHg — ABNORMAL HIGH (ref 32.0–48.0)
pH, Arterial: 7.37 (ref 7.350–7.450)
pO2, Arterial: 64 mmHg — ABNORMAL LOW (ref 83.0–108.0)

## 2018-12-09 LAB — LACTIC ACID, PLASMA
Lactic Acid, Venous: 2.9 mmol/L (ref 0.5–1.9)
Lactic Acid, Venous: 2.6 mmol/L (ref 0.5–1.9)

## 2018-12-09 LAB — SARS CORONAVIRUS 2 BY RT PCR (HOSPITAL ORDER, PERFORMED IN ~~LOC~~ HOSPITAL LAB): SARS Coronavirus 2: NEGATIVE

## 2018-12-09 LAB — PROCALCITONIN: Procalcitonin: 0.1 ng/mL

## 2018-12-09 LAB — STREP PNEUMONIAE URINARY ANTIGEN: Strep Pneumo Urinary Antigen: NEGATIVE

## 2018-12-09 MED ORDER — SODIUM CHLORIDE 0.9 % IV SOLN
1000.0000 mL | Freq: Once | INTRAVENOUS | Status: AC
  Administered 2018-12-09: 13:00:00 1000 mL via INTRAVENOUS

## 2018-12-09 MED ORDER — AMLODIPINE BESYLATE 5 MG PO TABS
5.0000 mg | ORAL_TABLET | Freq: Every day | ORAL | Status: DC
  Administered 2018-12-10 – 2018-12-13 (×4): 5 mg via ORAL
  Filled 2018-12-09 (×5): qty 1

## 2018-12-09 MED ORDER — ASPIRIN EC 81 MG PO TBEC
81.0000 mg | DELAYED_RELEASE_TABLET | Freq: Every day | ORAL | Status: DC
  Administered 2018-12-10 – 2018-12-16 (×7): 81 mg via ORAL
  Filled 2018-12-09 (×7): qty 1

## 2018-12-09 MED ORDER — SODIUM CHLORIDE 0.9 % IV SOLN
500.0000 mg | Freq: Once | INTRAVENOUS | Status: AC
  Administered 2018-12-09: 500 mg via INTRAVENOUS
  Filled 2018-12-09: qty 500

## 2018-12-09 MED ORDER — INSULIN ASPART 100 UNIT/ML ~~LOC~~ SOLN
0.0000 [IU] | Freq: Every day | SUBCUTANEOUS | Status: DC
  Administered 2018-12-09: 4 [IU] via SUBCUTANEOUS
  Administered 2018-12-13 – 2018-12-14 (×2): 2 [IU] via SUBCUTANEOUS
  Filled 2018-12-09 (×3): qty 1

## 2018-12-09 MED ORDER — SODIUM CHLORIDE 0.9 % IV SOLN
1.0000 g | Freq: Once | INTRAVENOUS | Status: AC
  Administered 2018-12-09: 12:00:00 1 g via INTRAVENOUS
  Filled 2018-12-09: qty 10

## 2018-12-09 MED ORDER — INSULIN ASPART 100 UNIT/ML ~~LOC~~ SOLN
0.0000 [IU] | Freq: Three times a day (TID) | SUBCUTANEOUS | Status: DC
  Administered 2018-12-09: 18:00:00 9 [IU] via SUBCUTANEOUS
  Administered 2018-12-10 (×2): 3 [IU] via SUBCUTANEOUS
  Administered 2018-12-10: 2 [IU] via SUBCUTANEOUS
  Administered 2018-12-12 – 2018-12-13 (×3): 1 [IU] via SUBCUTANEOUS
  Administered 2018-12-13: 3 [IU] via SUBCUTANEOUS
  Administered 2018-12-14: 08:00:00 1 [IU] via SUBCUTANEOUS
  Administered 2018-12-14 – 2018-12-16 (×6): 2 [IU] via SUBCUTANEOUS
  Filled 2018-12-09 (×15): qty 1

## 2018-12-09 MED ORDER — TRIAMCINOLONE ACETONIDE 0.1 % EX CREA
1.0000 "application " | TOPICAL_CREAM | Freq: Two times a day (BID) | CUTANEOUS | Status: DC | PRN
  Filled 2018-12-09: qty 15

## 2018-12-09 MED ORDER — GLIPIZIDE 5 MG PO TABS
5.0000 mg | ORAL_TABLET | Freq: Every day | ORAL | Status: DC
  Administered 2018-12-10 – 2018-12-11 (×2): 5 mg via ORAL
  Filled 2018-12-09 (×3): qty 1

## 2018-12-09 MED ORDER — SODIUM CHLORIDE 0.9 % IV SOLN
2.0000 g | INTRAVENOUS | Status: DC
  Administered 2018-12-10 – 2018-12-12 (×3): 2 g via INTRAVENOUS
  Filled 2018-12-09: qty 20
  Filled 2018-12-09 (×3): qty 2

## 2018-12-09 MED ORDER — SODIUM CHLORIDE 0.9 % IV SOLN
500.0000 mg | INTRAVENOUS | Status: DC
  Administered 2018-12-10 – 2018-12-11 (×2): 500 mg via INTRAVENOUS
  Filled 2018-12-09 (×2): qty 500

## 2018-12-09 MED ORDER — IPRATROPIUM-ALBUTEROL 0.5-2.5 (3) MG/3ML IN SOLN
3.0000 mL | Freq: Four times a day (QID) | RESPIRATORY_TRACT | Status: DC
  Administered 2018-12-09 – 2018-12-15 (×21): 3 mL via RESPIRATORY_TRACT
  Filled 2018-12-09 (×23): qty 3

## 2018-12-09 MED ORDER — LEVOTHYROXINE SODIUM 50 MCG PO TABS
50.0000 ug | ORAL_TABLET | Freq: Every day | ORAL | Status: DC
  Administered 2018-12-11 – 2018-12-16 (×6): 50 ug via ORAL
  Filled 2018-12-09 (×6): qty 1

## 2018-12-09 MED ORDER — IPRATROPIUM-ALBUTEROL 0.5-2.5 (3) MG/3ML IN SOLN: 3.0000 mL | Freq: Four times a day (QID) | RESPIRATORY_TRACT | Status: DC

## 2018-12-09 MED ORDER — APIXABAN 5 MG PO TABS
5.0000 mg | ORAL_TABLET | Freq: Two times a day (BID) | ORAL | Status: DC
  Administered 2018-12-09 – 2018-12-16 (×14): 5 mg via ORAL
  Filled 2018-12-09 (×14): qty 1

## 2018-12-09 MED ORDER — DOCUSATE SODIUM 100 MG PO CAPS
100.0000 mg | ORAL_CAPSULE | Freq: Two times a day (BID) | ORAL | Status: DC
  Administered 2018-12-12 – 2018-12-16 (×8): 100 mg via ORAL
  Filled 2018-12-09 (×11): qty 1

## 2018-12-09 MED ORDER — PRAVASTATIN SODIUM 20 MG PO TABS
20.0000 mg | ORAL_TABLET | Freq: Every day | ORAL | Status: DC
  Administered 2018-12-09 – 2018-12-15 (×7): 20 mg via ORAL
  Filled 2018-12-09 (×7): qty 1

## 2018-12-09 MED ORDER — PANTOPRAZOLE SODIUM 40 MG PO TBEC
40.0000 mg | DELAYED_RELEASE_TABLET | Freq: Every day | ORAL | Status: DC
  Administered 2018-12-10 – 2018-12-16 (×7): 40 mg via ORAL
  Filled 2018-12-09 (×7): qty 1

## 2018-12-09 MED ORDER — SODIUM CHLORIDE 0.9 % IV SOLN
INTRAVENOUS | Status: DC
  Administered 2018-12-09 – 2018-12-10 (×2): via INTRAVENOUS

## 2018-12-09 NOTE — ED Notes (Signed)
Patient transferred by this tech to room 252

## 2018-12-09 NOTE — ED Notes (Signed)
Pt continues to fidget. Mitts on hands so pt can not pull out lines. Sitter at bedside.

## 2018-12-09 NOTE — ED Notes (Signed)
ED TO INPATIENT HANDOFF REPORT  ED Nurse Name and Phone #: Marisue HumbleMaureen [phone]  S Name/Age/Gender Ruben Ruiz 85 y.o. male Room/Bed: ED05A/ED05A  Code Status   Code Status: DNR  Home/SNF/Other Home Pt disoriented x 4 Is this baseline? Yes   Triage Complete: Triage complete  Chief Complaint Unresponsive   Triage Note Pt found unresponsive at home. Per EMS, bagged on and off. Pt alert on arrival. EJ in place, on arrival left neck   Allergies No Known Allergies  Level of Care/Admitting Diagnosis ED Disposition    ED Disposition Condition Comment   Admit  [facility] [[id number]]  Level of Care: Med-Surg [16]  Covid Evaluation: Person Under Investigation (PUI)  Diagnosis: Pneumonia [227785]  Admitting Physician: Cristie HemUMA, ELIZABETH ACHIENG [[id number]]  Attending Physician: Webb SilversmithUMA, ELIZABETH ACHIENG [phone][[id number]]  Estimated length of stay: past midnight tomorrow  Certification:: I certify this patient will need inpatient services for at least 2 midnights  PT Class (Do Not Modify): Inpatient [101]  PT Acc Code (Do Not Modify): Private [1]       B Medical/Surgery History Past Medical History:  Diagnosis Date  . Dementia (HCC)   . Diabetes mellitus without complication (HCC)   . GERD (gastroesophageal reflux disease)   . HOH (hard of hearing)    AID  . Hypertension    Past Surgical History:  Procedure Laterality Date  . CATARACT EXTRACTION W/PHACO Left 08/29/2017   Procedure: CATARACT EXTRACTION PHACO AND INTRAOCULAR LENS PLACEMENT (IOC);  Surgeon: Lockie MolaBrasington, Chadwick, MD;  Location: [facility] ORS;  Service: Ophthalmology;  Laterality: Left;  [place] 01:45.0 AP% 31.2 CDE 24.15 Fluid Pack Lot # [id number]  . EYE SURGERY       A IV Location/Drains/Wounds Patient Lines/Drains/Airways Status   Active Line/Drains/Airways    Name:   Placement date:   Placement time:   Site:   Days:   Peripheral IV 12/09/18 Left Wrist   12/09/18    1041    Wrist   less than 1    Peripheral IV 12/09/18 Right Wrist   12/09/18    1042    Wrist   less than 1   Incision (Closed) 07/12/17 Eye Left   07/12/17    0704     515   Incision (Closed) 08/29/17 Eye Left   08/29/17    0654     467          Intake/Output Last 24 hours  Intake/Output Summary (Last 24 hours) at 12/09/2018 1329 Last data filed at 12/09/2018 1310 Gross per 24 hour  Intake 100 ml  Output -  Net 100 ml    Labs/Imaging Results for orders placed or performed during the hospital encounter of 12/09/18 (from the past 48 hour(s))  Blood gas, arterial ([facility], [facility], [facility])     Status: Abnormal   Collection Time: 12/09/18 10:34 AM  Result Value Ref Range   FIO2 0.36    Delivery systems NASAL CANNULA    pH, Arterial 7.37 7.350 - 7.450   pCO2 arterial 65 (H) 32.0 - 48.0 mmHg   pO2, Arterial 64 (L) 83.0 - 108.0 mmHg   Bicarbonate 37.6 (H) 20.0 - 28.0 mmol/L   Acid-Base Excess 9.7 (H) 0.0 - 2.0 mmol/L   O2 Saturation 91.4 %   Patient temperature 37.0    Collection site LEFT RADIAL    Sample type ARTERIAL DRAW     Comment: Performed at [facility], [address] [place], [place] [address]  SARS Coronavirus  2 [facility](Hospital order, Performed in [facility]) Nasopharyngeal Nasopharyngeal Swab     Status: None   Collection Time: 12/09/18 10:35 AM   Specimen: Nasopharyngeal Swab  Result Value Ref Range   SARS Coronavirus 2 NEGATIVE NEGATIVE    Comment: (NOTE) If result is NEGATIVE SARS-CoV-2 target nucleic acids are NOT DETECTED. The SARS-CoV-2 RNA is generally detectable in upper and lower  respiratory specimens during the acute phase of infection. The lowest  concentration of SARS-CoV-2 viral copies this assay can detect is 250  copies / mL. A negative result does not preclude SARS-CoV-2 infection  and should not be used as the sole basis for treatment or other  patient management decisions.  A negative result may occur with  improper specimen collection / handling, submission of  specimen other  than nasopharyngeal swab, presence of viral mutation(s) within the  areas targeted by this assay, and inadequate number of viral copies  (<250 copies / mL). A negative result must be combined with clinical  observations, patient history, and epidemiological information. If result is POSITIVE SARS-CoV-2 target nucleic acids are DETECTED. The SARS-CoV-2 RNA is generally detectable in upper and lower  respiratory specimens dur ing the acute phase of infection.  Positive  results are indicative of active infection with SARS-CoV-2.  Clinical  correlation with patient history and other diagnostic information is  necessary to determine patient infection status.  Positive results do  not rule out bacterial infection or co-infection with other viruses. If result is PRESUMPTIVE POSTIVE SARS-CoV-2 nucleic acids MAY BE PRESENT.   A presumptive positive result was obtained on the submitted specimen  and confirmed on repeat testing.  While 2019 novel coronavirus  (SARS-CoV-2) nucleic acids may be present in the submitted sample  additional confirmatory testing may be necessary for epidemiological  and / or clinical management purposes  to differentiate between  SARS-CoV-2 and other Sarbecovirus currently known to infect humans.  If clinically indicated additional testing with an alternate test  methodology [phone]([id number]) is advised. The SARS-CoV-2 RNA is generally  detectable in upper and lower respiratory sp ecimens during the acute  phase of infection. The expected result is Negative. Fact Sheet for Patients:  BoilerBrush.com.cyhttps://www.fda.gov/media/136312/download Fact Sheet for Healthcare Providers: https://pope.com/https://www.fda.gov/media/136313/download This test is not yet approved or cleared by the [place] FDA and has been authorized for detection and/or diagnosis of SARS-CoV-2 by FDA under an Emergency Use Authorization (EUA).  This EUA will remain in effect (meaning this test can be used) for the  duration of the COVID-19 declaration under Section 564(b)(1) of the Act, 21 U.S.C. section 360bbb-3(b)(1), unless the authorization is terminated or revoked sooner. Performed at [facility], [address] [place], [place] [address]   Comprehensive metabolic panel     Status: Abnormal   Collection Time: 12/09/18 10:40 AM  Result Value Ref Range   Sodium 142 135 - 145 mmol/L   Potassium 4.7 3.5 - 5.1 mmol/L    Comment: HEMOLYSIS AT THIS LEVEL MAY AFFECT RESULT   Chloride 102 98 - 111 mmol/L   CO2 27 22 - 32 mmol/L   Glucose, Bld 440 (H) 70 - 99 mg/dL   BUN 34 (H) 8 - 23 mg/dL   Creatinine, Ser 8.111.85 (H) 0.61 - 1.24 mg/dL   Calcium 9.3 8.9 - 91.410.3 mg/dL   Total Protein 7.0 6.5 - 8.1 g/dL   Albumin 3.6 3.5 - 5.0 g/dL   AST 35 15 - 41 U/L   ALT 30 0 -  44 U/L   Alkaline Phosphatase 140 (H) 38 - 126 U/L   Total Bilirubin 1.9 (H) 0.3 - 1.2 mg/dL   GFR calc non Af Amer 29 (L) >60 mL/min   GFR calc Af Amer 34 (L) >60 mL/min   Anion gap 13 5 - 15    Comment: Performed at [facility], [address] [place], [place] [address]  CBC WITH DIFFERENTIAL     Status: Abnormal   Collection Time: 12/09/18 10:40 AM  Result Value Ref Range   WBC 7.3 4.0 - 10.5 K/uL   RBC 4.92 4.22 - 5.81 MIL/uL   Hemoglobin 12.3 (L) 13.0 - 17.0 g/dL   HCT 40.2 39.0 - 52.0 %   MCV 81.7 80.0 - 100.0 fL   MCH 25.0 (L) 26.0 - 34.0 pg   MCHC 30.6 30.0 - 36.0 g/dL   RDW 16.6 (H) 11.5 - 15.5 %   Platelets 118 (L) 150 - 400 K/uL   nRBC 0.0 0.0 - 0.2 %   Neutrophils Relative % 83 %   Neutro Abs 6.1 1.7 - 7.7 K/uL   Lymphocytes Relative 7 %   Lymphs Abs 0.5 (L) 0.7 - 4.0 K/uL   Monocytes Relative 9 %   Monocytes Absolute 0.6 0.1 - 1.0 K/uL   Eosinophils Relative 0 %   Eosinophils Absolute 0.0 0.0 - 0.5 K/uL   Basophils Relative 1 %   Basophils Absolute 0.0 0.0 - 0.1 K/uL   Immature Granulocytes 0 %   Abs Immature Granulocytes 0.03 0.00 - 0.07 K/uL    Comment: Performed at [facility], [address] [place], [place] [address]  Urinalysis, Complete w Microscopic     Status: Abnormal   Collection Time: 12/09/18 10:46 AM  Result Value Ref Range   Color, Urine AMBER (A) YELLOW    Comment: BIOCHEMICALS MAY BE AFFECTED BY COLOR   APPearance CLOUDY (A) CLEAR   Specific Gravity, Urine 1.025 1.005 - 1.030   pH 5.0 5.0 - 8.0   Glucose, UA >=500 (A) NEGATIVE mg/dL   Hgb urine dipstick SMALL (A) NEGATIVE   Bilirubin Urine NEGATIVE NEGATIVE   Ketones, ur NEGATIVE NEGATIVE mg/dL   Protein, ur 100 (A) NEGATIVE mg/dL   Nitrite NEGATIVE NEGATIVE   Leukocytes,Ua NEGATIVE NEGATIVE   RBC / HPF 6-10 0 - 5 RBC/hpf   WBC, UA 0-5 0 - 5 WBC/hpf   Bacteria, UA NONE SEEN NONE SEEN   Squamous Epithelial / LPF 0-5 0 - 5   Mucus PRESENT    Hyaline Casts, UA PRESENT     Comment: Performed at [facility], [address] [place], [place] [address]  Lactic acid, plasma     Status: Abnormal   Collection Time: 12/09/18 11:35 AM  Result Value Ref Range   Lactic Acid, Venous 2.9 (HH) 0.5 - 1.9 mmol/L    Comment: CRITICAL RESULT CALLED TO, READ BACK BY AND VERIFIED WITH Matilyn Fehrman ON 12/09/2018 AT 1236 QSD Performed at [facility], [address], [place], [place] [address]    Dg Chest Port 1 View  Result Date: 12/09/2018 CLINICAL DATA:  Unresponsiveness. EXAM: PORTABLE CHEST 1 VIEW COMPARISON:  07/05/2018 FINDINGS: Stable cardiomediastinal contours. Aortic calcified and tortuous. Patchy right lower lobe airspace opacity. Small right pleural effusion. Left lung is clear. No pneumothorax. No acute osseous abnormality. IMPRESSION: 1. Right lower lobe airspace opacity suspicious for pneumonia. 2. Small right pleural effusion. Electronically Signed   By: Marisue Brooklyn.D.  On: 12/09/2018 11:02    Pending Labs Unresulted Labs (From admission, onward)    Start     Ordered   12/10/18 0500  Procalcitonin  Daily,   STAT     12/09/18 1328   12/09/18 1328   Procalcitonin - Baseline  ONCE - STAT,   STAT     12/09/18 1328   12/09/18 1326  Legionella Pneumophila Serogp 1 Ur Ag  Once,   STAT     12/09/18 1328   12/09/18 1326  Strep pneumoniae urinary antigen  Once,   STAT     12/09/18 1328   12/09/18 1325  HIV antibody (Routine Screening)  Once,   STAT     12/09/18 1328   12/09/18 1033  Lactic acid, plasma  STAT Now then every 3 hours,   STAT     12/09/18 1033   12/09/18 1033  Blood Culture (routine x 2)  BLOOD CULTURE X 2,   STAT     12/09/18 1033   12/09/18 1033  Urine culture  ONCE - STAT,   STAT     12/09/18 1033          Vitals/Pain Today's Vitals   12/09/18 1053 12/09/18 1100 12/09/18 1130 12/09/18 1300  BP:  (!) 136/92 (!) 136/95 (!) 149/103  Pulse:   (!) 103 89  Resp:  17 (!) 27 18  Temp: 98.4 F (36.9 C)     TempSrc: Oral     SpO2: 100%  100% 96%  Weight:      Height:        Isolation Precautions Airborne and Contact precautions  Medications Medications  azithromycin (ZITHROMAX) 500 mg in sodium chloride 0.9 % 250 mL IVPB (500 mg Intravenous New Bag/Given 12/09/18 1314)  aspirin EC tablet 81 mg (has no administration in time range)  amLODipine (NORVASC) tablet 5 mg (has no administration in time range)  pravastatin (PRAVACHOL) tablet 20 mg (has no administration in time range)  glipiZIDE (GLUCOTROL) tablet 5 mg (has no administration in time range)  levothyroxine (SYNTHROID) tablet 50 mcg (has no administration in time range)  docusate sodium (COLACE) capsule 100 mg (has no administration in time range)  pantoprazole (PROTONIX) EC tablet 40 mg (has no administration in time range)  apixaban (ELIQUIS) tablet 5 mg (has no administration in time range)  triamcinolone cream (KENALOG) 0.1 % 1 application (has no administration in time range)  0.9 %  sodium chloride infusion (has no administration in time range)  cefTRIAXone (ROCEPHIN) 2 g in sodium chloride 0.9 % 100 mL IVPB (has no administration in time range)   azithromycin (ZITHROMAX) 500 mg in sodium chloride 0.9 % 250 mL IVPB (has no administration in time range)  cefTRIAXone (ROCEPHIN) 1 g in sodium chloride 0.9 % 100 mL IVPB (0 g Intravenous Stopped 12/09/18 1310)  0.9 %  sodium chloride infusion (1,000 mLs Intravenous New Bag/Given 12/09/18 1316)    Mobility non-ambulatory High fall risk   Focused Assessments see assessments   R Recommendations: See Admitting Provider Note  Report given to:   Additional Notes: NA

## 2018-12-09 NOTE — ED Notes (Signed)
Notified by floor that room is being stat cleaned. Will wait to call report.

## 2018-12-09 NOTE — ED Triage Notes (Signed)
Pt found unresponsive at home. Per EMS, bagged on and off. Pt alert on arrival. EJ in place, on arrival left neck

## 2018-12-09 NOTE — ED Notes (Signed)
First attempt to call report. RN and charge not available, will call back

## 2018-12-09 NOTE — Progress Notes (Signed)
CODE SEPSIS - PHARMACY COMMUNICATION  **Broad Spectrum Antibiotics should be administered within 1 hour of Sepsis diagnosis**  Time Code Sepsis Called/Page Received: 1243  Antibiotics Ordered: Rocephin/azithromycin  Time of 1st antibiotic administration: 1228  Additional action taken by pharmacy: NA  If necessary, Name of Provider/Nurse Contacted: NA    Tawnya Crook ,PharmD Clinical Pharmacist  12/09/2018  1:03 PM

## 2018-12-09 NOTE — Progress Notes (Signed)
Ronn Melena, NP if patient can have breathing treatment, order given. RN will continue to monitor.

## 2018-12-09 NOTE — H&P (Signed)
[facility] NAME: Ruben Ruiz    MR#:  [id number]  DATE OF BIRTH:  07-06-1917  DATE OF ADMISSION:  12/09/2018  PRIMARY CARE PHYSICIAN: Denton Lank, MD   REQUESTING/REFERRING PHYSICIAN: Lavonia Drafts, MD  CHIEF COMPLAINT:  No chief complaint on file.   HISTORY OF PRESENT ILLNESS:   85 year old male with a past medical history of bilateral PE, hyperlipidemia, hypothyroidism, diabetes mellitus, hypertension, syncopal episodes, and anemia presenting to the ED with altered mental status.  Patient unable to provide history so history mostly obtained from patient's chart.  Per ED reports patient was found unresponsive on the floor per wife.  No reports of associated symptoms prior to or following the fall.  He has history of syncope however this was not witnessed per wife.  On arrival to the ED, he was afebrile with blood pressure 128/95 mm Hg and pulse rate 103 beats/min. There were no focal neurological deficits; he was alert and oriented x 2.  Initial labs revealed PCO2 65, bicarb 37.6, PO2 64, blood glucose 440, BUN 34, creatinine 1.85, alk phos 140, total bilirubin 1.9, WBC 7.3, platelets 118.  UA negative for UTI.  Chest x-ray showed right lower lobe airspace opacity suspicious for pneumonia and small right pleural effusion.  PAST MEDICAL HISTORY:   Past Medical History:  Diagnosis Date  . Dementia ([place])   . Diabetes mellitus without complication ([place])   . GERD (gastroesophageal reflux disease)   . HOH (hard of hearing)    AID  . Hypertension     PAST SURGICAL HISTORY:   Past Surgical History:  Procedure Laterality Date  . CATARACT EXTRACTION W/PHACO Left 08/29/2017   Procedure: CATARACT EXTRACTION PHACO AND INTRAOCULAR LENS PLACEMENT (IOC);  Surgeon: Leandrew Koyanagi, MD;  Location: [facility] ORS;  Service: Ophthalmology;  Laterality: Left;  [place] 01:45.0 AP% 31.2 CDE 24.15 Fluid Pack Lot # [id number]  . EYE SURGERY      SOCIAL HISTORY:   Social History   Tobacco Use  . Smoking status: Former Research scientist (life sciences)  . Smokeless tobacco: Never Used  Substance Use Topics  . Alcohol use: No    FAMILY HISTORY:  History reviewed. No pertinent family history.  DRUG ALLERGIES:  No Known Allergies  REVIEW OF SYSTEMS:   ROS Unable to obtain from patient due to altered mental status MEDICATIONS AT HOME:   Prior to Admission medications   Medication Sig Start Date End Date Taking? Authorizing Provider  amLODipine (NORVASC) 5 MG tablet Take 5 mg by mouth daily. 09/29/16  Yes [provider]  apixaban (ELIQUIS) 5 MG TABS tablet Take 5 mg by mouth 2 (two) times daily.   Yes [provider]  glipiZIDE (GLUCOTROL) 5 MG tablet Take 1 tablet (5 mg total) by mouth daily. 04/16/18 04/16/19 Yes Gladstone Lighter, MD  levothyroxine (SYNTHROID, LEVOTHROID) 50 MCG tablet Take 1 tablet (50 mcg total) by mouth daily before breakfast. 04/17/18  Yes Gladstone Lighter, MD  losartan (COZAAR) 100 MG tablet Take 100 mg by mouth at bedtime.    Yes [provider]  metFORMIN (GLUCOPHAGE) 1000 MG tablet Take 1,000 mg by mouth daily.   Yes [provider]  omeprazole (PRILOSEC) 20 MG capsule Take 2 capsules (40 mg total) by mouth at bedtime. 07/03/18  Yes Vaughan Basta, MD  pravastatin (PRAVACHOL) 20 MG tablet Take 1 tablet (20 mg total) by mouth at bedtime. 07/03/18  Yes Vaughan Basta, MD  triamcinolone cream (KENALOG) 0.1 % Apply  1 application topically 2 (two) times daily.   Yes [provider]  aspirin EC 81 MG tablet Take 1 tablet (81 mg total) by mouth daily. 07/03/18   Vaughan Basta, MD      VITAL SIGNS:  Blood pressure (!) 147/93, pulse 79, temperature (!) 96.3 F (35.7 C), temperature source Axillary, resp. rate (!) 22, height 6' (1.829 m), weight 81.6 kg, SpO2 96 %.  PHYSICAL EXAMINATION:   Physical Exam  GENERAL:  85 y.o.-year-old patient lying in the bed with  no acute distress.  EYES: Pupils equal, round, reactive to light and accommodation. No scleral icterus. Extraocular muscles intact.  HEENT: Head atraumatic, normocephalic. Oropharynx and nasopharynx clear.  NECK:  Supple, no jugular venous distention. No thyroid enlargement, no tenderness.  LUNGS: Decreasedl breath sounds bilaterally,bilateral wheezing, rales and rhonchi. No crepitation. No use of accessory muscles of respiration.  CARDIOVASCULAR: S1, S2 normal. No murmurs, rubs, or gallops.  ABDOMEN: Soft, nontender, nondistended. Bowel sounds present. No organomegaly or mass.  EXTREMITIES: Bilateral pedal edema right>left, cyanosis, or clubbing. No rash or lesions. + pedal pulses MUSCULOSKELETAL: Normal bulk, and power was 5+ grip and elbow, knee, and ankle flexion and extension bilaterally.  NEUROLOGIC: Opens her eyes to deep sternal rub.  Very hard of hearing.  Oriented to person and place.  CN 2-12 intact. Sensation to light touch and cold stimuli intact bilaterally. DTR's (biceps, patellar, and achilles) 2+ and symmetric throughout. Gait not tested due to safety concern. PSYCHIATRIC: The patient is alert and oriented x 2.  SKIN: No obvious rash, lesion, or ulcer.   DATA REVIEWED:  LABORATORY PANEL:   CBC Recent Labs  Lab 12/09/18 1040  WBC 7.3  HGB 12.3*  HCT 40.2  PLT 118*   ------------------------------------------------------------------------------------------------------------------  Chemistries  Recent Labs  Lab 12/09/18 1040  NA 142  K 4.7  CL 102  CO2 27  GLUCOSE 440*  BUN 34*  CREATININE 1.85*  CALCIUM 9.3  AST 35  ALT 30  ALKPHOS 140*  BILITOT 1.9*   ------------------------------------------------------------------------------------------------------------------  Cardiac Enzymes No results for input(s): TROPONINI in the last 168 hours. ------------------------------------------------------------------------------------------------------------------   RADIOLOGY:  Dg Chest Port 1 View  Result Date: 12/09/2018 CLINICAL DATA:  Unresponsiveness. EXAM: PORTABLE CHEST 1 VIEW COMPARISON:  07/05/2018 FINDINGS: Stable cardiomediastinal contours. Aortic calcified and tortuous. Patchy right lower lobe airspace opacity. Small right pleural effusion. Left lung is clear. No pneumothorax. No acute osseous abnormality. IMPRESSION: 1. Right lower lobe airspace opacity suspicious for pneumonia. 2. Small right pleural effusion. Electronically Signed   By: Davina Poke M.D.   On: 12/09/2018 11:02    EKG:  EKG: unchanged from previous tracings, junctional rythm. Vent. rate 99 BPM PR interval * ms QRS duration 57 ms QT/QTc 408/524 ms P-R-T axes * 91 88 IMPRESSION AND PLAN:   85 y.o. male with a past medical history of bilateral PE, hyperlipidemia, hypothyroidism, diabetes mellitus, hypertension, syncopal episodes, and anemia presenting to the ED with altered mental status.  1.  Altered mental status - unclear etiology but maybe due to pneumonia. Patient found on the floor unresponsive per wife. - Admit to MedSurg unit - CT head without contrast rule out hemorrhage as he is also on anticoagulation - Check TSH and free T4  2. Acute respiratory failure likely secondary to pneumonia -Supplemental oxygen -DuoNebs every 6 for wheezing -Check procalcitonin -Check strep and Legionella urine antigen  3. Right lower lobe pneumonia -Chest x-ray shows right lower lobe airspace opacity suspicious for pneumonia and  small right pleural effusion -Blood cultures pending -Start empiric antibiotics with ceftriaxone and azithromycin  4. AKI -likely prerenal -Hold lisinopril -Gentle IV fluids hydration -Continue to monitor renal function  5. History of bilateral PE -Continue Eliquis  6. HTN  + Goal BP <130/80 -Continue amlodipine -Hold lisinopril  7. Diabetes Mellitus Type 2 with complications - Check YWVX4C - Hold metformin + glipizide - CBG  monitoring - SSI - DM education and close PCP follow up  8. Hypothyroidism -continue Synthroid   All the records are reviewed and case discussed with ED provider. Management plans discussed with the patient, family and they are in agreement.  CODE STATUS: DNR  TOTAL TIME TAKING CARE OF THIS PATIENT: 50 minutes.    on 12/09/2018 at 5:41 PM  Rufina Falco, DNP, FNP-BC [facility] Nurse Practitioner Between 7am to 6pm - Pager [phone]  After 6pm go to [email or web address] - password [email or web address] [facility]  Office  [phone]  CC: Primary care physician; Denton Lank, MD

## 2018-12-09 NOTE — Progress Notes (Signed)
Talked to [facility], NP about patient's being lethargic, did a sternal rub will open his eyes but will drift back to sleep. ABG was done in the ED.Head CT ordered stat, since patient's wife reported that patient fell and she found him on the floor. Also with him being lethargic, patient is unable to take oral meds or any oral intake. Order to hold oral meds.

## 2018-12-09 NOTE — ED Provider Notes (Signed)
[facility] Emergency Department Provider Note   ____________________________________________    I have reviewed the triage vital signs and the nursing notes.   HISTORY  Chief Complaint Respiratory distress  History limited by dementia  HPI Ruben Ruiz is a 85 y.o. male with a history of dementia diabetes hypertension who presents with reports of respiratory distress.  EMS reports the patient was found to have difficulty breathing today, they reports they need to provide supplemental bag oxygen in route.  Patient has apparently had a cough for several days.  Patient states that "he feels fine".  Past Medical History:  Diagnosis Date  . Dementia (HCC)   . Diabetes mellitus without complication (HCC)   . GERD (gastroesophageal reflux disease)   . HOH (hard of hearing)    AID  . Hypertension     Patient Active Problem List   Diagnosis Date Noted  . Elevated troponin 07/02/2018  . AKI (acute kidney injury) (HCC) 04/15/2018  . Hypertensive urgency 06/20/2015  . Hypoglycemia associated with diabetes (HCC) 06/20/2015  . Anemia 06/20/2015  . Syncope 06/18/2015    Past Surgical History:  Procedure Laterality Date  . CATARACT EXTRACTION W/PHACO Left 08/29/2017   Procedure: CATARACT EXTRACTION PHACO AND INTRAOCULAR LENS PLACEMENT (IOC);  Surgeon: Lockie MolaBrasington, Chadwick, MD;  Location: [facility] ORS;  Service: Ophthalmology;  Laterality: Left;  US 01:45.0 AP% 31.2 CDE 24.15 Fluid Pack Lot # [id number]  . EYE SURGERY      Prior to Admission medications   Medication Sig Start Date End Date Taking? Authorizing Provider  amLODipine (NORVASC) 5 MG tablet Take 5 mg by mouth daily. 09/29/16   [provider]  aspirin EC 81 MG tablet Take 1 tablet (81 mg total) by mouth daily. 07/03/18   Altamese DillingVachhani, Vaibhavkumar, MD  docusate sodium (COLACE) 100 MG capsule Take 1 capsule (100 mg total) by mouth 2 (two) times daily. 04/16/18   Enid BaasKalisetti, Radhika, MD  ELIQUIS  STARTER PACK (ELIQUIS STARTER PACK) 5 MG TABS Take as directed on package: start with two-5mg  tablets twice daily for 7 days. On day 8, switch to one-5mg  tablet twice daily. Patient taking differently: Take 5 mg by mouth 2 (two) times daily. Take as directed on package: start with two-5mg  tablets twice daily for 7 days. On day 8, switch to one-5mg  tablet twice daily. 04/16/18   Enid BaasKalisetti, Radhika, MD  glipiZIDE (GLUCOTROL) 5 MG tablet Take 1 tablet (5 mg total) by mouth daily. 04/16/18 04/16/19  Enid BaasKalisetti, Radhika, MD  levothyroxine (SYNTHROID, LEVOTHROID) 50 MCG tablet Take 1 tablet (50 mcg total) by mouth daily before breakfast. 04/17/18   Enid BaasKalisetti, Radhika, MD  losartan (COZAAR) 100 MG tablet Take 100 mg by mouth at bedtime.     [provider]  omeprazole (PRILOSEC) 20 MG capsule Take 2 capsules (40 mg total) by mouth at bedtime. 07/03/18   Altamese DillingVachhani, Vaibhavkumar, MD  pravastatin (PRAVACHOL) 20 MG tablet Take 1 tablet (20 mg total) by mouth at bedtime. 07/03/18   Altamese DillingVachhani, Vaibhavkumar, MD  triamcinolone cream (KENALOG) 0.1 % Apply 1 application topically 2 (two) times daily.    [provider]     Allergies Patient has no known allergies.  History reviewed. No pertinent family history.  Social History Social History   Tobacco Use  . Smoking status: Former Games developermoker  . Smokeless tobacco: Never Used  Substance Use Topics  . Alcohol use: No  . Drug use: Never    Review of Systems limited by dementia  Constitutional:  No reports of fever  Cardiovascular: Denies chest pain. Respiratory: Cough, shortness of breath Gastrointestinal: No abdominal pain or vomiting.   Genitourinary: Negative for dysuria. Musculoskeletal: Negative for joint swelling Skin: Negative for rash.    ____________________________________________   PHYSICAL EXAM:  VITAL SIGNS: ED Triage Vitals  Enc Vitals Group     BP 12/09/18 1030 (!) 128/95     Pulse Rate 12/09/18 1030 98     Resp  12/09/18 1030 20     Temp 12/09/18 1053 98.4 F (36.9 C)     Temp Source 12/09/18 1053 Oral     SpO2 12/09/18 1030 97 %     Weight 12/09/18 1034 81.6 kg (180 lb)     Height 12/09/18 1034 1.829 m (6')     Head Circumference --      Peak Flow --      Pain Score --      Pain Loc --      Pain Edu? --      Excl. in [place]? --     Constitutional: Alert  Eyes: Conjunctivae are normal.   Nose: No congestion/rhinnorhea.  Cardiovascular: Normal rate, regular rhythm. Grossly normal heart sounds.  Good peripheral circulation. Respiratory: Mild tachypnea.  No retractions.  Bibasilar Rales Gastrointestinal: Soft and nontender. No distention.  No CVA tenderness. Genitourinary: deferred Musculoskeletal: 1+ edema bilaterally warm and well perfused Neurologic:  Normal speech and language. No gross focal neurologic deficits are appreciated.  Skin:  Skin is warm, dry and intact. No rash noted.   ____________________________________________   LABS (all labs ordered are listed, but only abnormal results are displayed)  Labs Reviewed  COMPREHENSIVE METABOLIC PANEL - Abnormal; Notable for the following components:      Result Value   Glucose, Bld 440 (*)    BUN 34 (*)    Creatinine, Ser 1.85 (*)    Alkaline Phosphatase 140 (*)    Total Bilirubin 1.9 (*)    GFR calc non Af Amer 29 (*)    GFR calc Af Amer 34 (*)    All other components within normal limits  CBC WITH DIFFERENTIAL/PLATELET - Abnormal; Notable for the following components:   Hemoglobin 12.3 (*)    MCH 25.0 (*)    RDW 16.6 (*)    Platelets 118 (*)    Lymphs Abs 0.5 (*)    All other components within normal limits  BLOOD GAS, ARTERIAL - Abnormal; Notable for the following components:   pCO2 arterial 65 (*)    pO2, Arterial 64 (*)    Bicarbonate 37.6 (*)    Acid-Base Excess 9.7 (*)    All other components within normal limits  URINALYSIS, COMPLETE (UACMP) WITH MICROSCOPIC - Abnormal; Notable for the following components:    Color, Urine AMBER (*)    APPearance CLOUDY (*)    Glucose, UA >=500 (*)    Hgb urine dipstick SMALL (*)    Protein, ur 100 (*)    All other components within normal limits  SARS CORONAVIRUS 2 (HOSPITAL ORDER, [place] [facility])  CULTURE, BLOOD (ROUTINE X 2)  CULTURE, BLOOD (ROUTINE X 2)  URINE CULTURE  LACTIC ACID, PLASMA  LACTIC ACID, PLASMA   ____________________________________________  EKG  ED ECG REPORT I, Lavonia Drafts, the attending physician, personally viewed and interpreted this ECG.  Date: 12/09/2018  Rhythm: normal sinus rhythm QRS Axis: normal Intervals: normal ST/T Wave abnormalities: normal Narrative Interpretation: no evidence of acute ischemia  ____________________________________________  RADIOLOGY  Right  lower lobe airspace disease on x-ray, viewed by me ____________________________________________   PROCEDURES  Procedure(s) performed: No  Procedures   Critical Care performed: No ____________________________________________   INITIAL IMPRESSION / ASSESSMENT AND PLAN / ED COURSE  Pertinent labs & imaging results that were available during my care of the patient were reviewed by me and considered in my medical decision making (see chart for details).  Patient presents with shortness of breath, reports of being bagged by EMS however patient is alert but confused and breathing on his own and satting in the high 90s on nasal cannula.  Pending labs, chest x-ray, we will monitor closely   Chest x-ray concerning for pneumonia, IV Rocephin and azithromycin ordered  Lab work significant for mild hyperglycemia, dehydration, normal white blood cell count.  Will discuss with the hospitalist for admission     ____________________________________________   FINAL CLINICAL IMPRESSION(S) / ED DIAGNOSES  Final diagnoses:  Community acquired pneumonia of right lower lobe of lung (HCC)        Note:  This document was  prepared using Dragon voice recognition software and may include unintentional dictation errors.   Jene EveryKinner, Antwanette Wesche, MD 12/09/18 1230

## 2018-12-09 NOTE — ED Notes (Signed)
Pt with pitting edema to trunk and legs and arms bilaterally. Pt responds to voice. Pt continues to pull at lines and O2. Sitter at bedside.

## 2018-12-09 NOTE — ED Notes (Signed)
Pt continues to be agitated regularly, pulling off leads and O2. Sitter at bedside.

## 2018-12-09 NOTE — Sepsis Progress Note (Signed)
Notified Karen Kays, NP of sepsis fluid resuscitation requirement for this pt based on 81.6kg. She does not think he will benefit from full fluid resuscitation orders.  States he is at risk for overload given his hx, peripheral edema and pleural effusion on chest xray. So plans to keep him on IVF maintenance and monitor.

## 2018-12-09 NOTE — ED Notes (Signed)
Pt responding to voice, opens eyes, does not follow commands, pt HOH. Pt appears comfortable at present.

## 2018-12-09 NOTE — ED Notes (Signed)
Pt continues to be agitated at times, pulling at lines, pulled out EJ. Pressure dressing applied. Pt continues to have sitter at bedside.

## 2018-12-10 LAB — URINE CULTURE: Culture: NO GROWTH

## 2018-12-10 LAB — GLUCOSE, CAPILLARY
Glucose-Capillary: 195 mg/dL — ABNORMAL HIGH (ref 70–99)
Glucose-Capillary: 214 mg/dL — ABNORMAL HIGH (ref 70–99)
Glucose-Capillary: 216 mg/dL — ABNORMAL HIGH (ref 70–99)
Glucose-Capillary: 183 mg/dL — ABNORMAL HIGH (ref 70–99)

## 2018-12-10 LAB — BASIC METABOLIC PANEL
Anion gap: 11 (ref 5–15)
BUN: 32 mg/dL — ABNORMAL HIGH (ref 8–23)
CO2: 31 mmol/L (ref 22–32)
Calcium: 9.3 mg/dL (ref 8.9–10.3)
Chloride: 103 mmol/L (ref 98–111)
Creatinine, Ser: 1.74 mg/dL — ABNORMAL HIGH (ref 0.61–1.24)
GFR calc Af Amer: 37 mL/min — ABNORMAL LOW (ref >60)
GFR calc non Af Amer: 32 mL/min — ABNORMAL LOW (ref >60)
Glucose, Bld: 222 mg/dL — ABNORMAL HIGH (ref 70–99)
Potassium: 3.9 mmol/L (ref 3.5–5.1)
Sodium: 145 mmol/L (ref 135–145)

## 2018-12-10 LAB — CBC
HCT: 41 % (ref 39.0–52.0)
Hemoglobin: 12.5 g/dL — ABNORMAL LOW (ref 13.0–17.0)
MCH: 25.1 pg — ABNORMAL LOW (ref 26.0–34.0)
MCHC: 30.5 g/dL (ref 30.0–36.0)
MCV: 82.3 fL (ref 80.0–100.0)
Platelets: 145 10*3/uL — ABNORMAL LOW (ref 150–400)
RBC: 4.98 MIL/uL (ref 4.22–5.81)
RDW: 16.8 % — ABNORMAL HIGH (ref 11.5–15.5)
WBC: 7.4 10*3/uL (ref 4.0–10.5)
nRBC: 0 % (ref 0.0–0.2)

## 2018-12-10 LAB — MAGNESIUM: Magnesium: 2.7 mg/dL — ABNORMAL HIGH (ref 1.7–2.4)

## 2018-12-10 LAB — PROCALCITONIN: Procalcitonin: <0.1 ng/mL

## 2018-12-10 NOTE — Progress Notes (Signed)
Inpatient Diabetes Program Recommendations  AACE/ADA: New Consensus Statement on Inpatient Glycemic Control   Target Ranges:  Prepandial:   less than 140 mg/dL      Peak postprandial:   less than 180 mg/dL (1-2 hours)      Critically ill patients:  140 - 180 mg/dL  Results for Ruben Ruiz, Ruben Ruiz (MRN [id number]) as of 12/10/2018 08:14  Ref. Range 12/09/2018 16:30 12/09/2018 16:31 12/09/2018 21:10 12/10/2018 07:31  Glucose-Capillary Latest Ref Range: 70 - 99 mg/dL 437 (H) 364 (H) 340 (H) 195 (H)  Results for Ruben Ruiz, Ruben Ruiz (MRN [id number]) as of 12/10/2018 08:14  Ref. Range 12/09/2018 10:40  Glucose Latest Ref Range: 70 - 99 mg/dL 440 (H)   Results for Ruben Ruiz, Ruben Ruiz (MRN [id number]) as of 12/10/2018 08:14  Ref. Range 07/02/2018 11:58  Hemoglobin A1C Latest Ref Range: 4.8 - 5.6 % 7.4 (H)   Review of Glycemic Control  Diabetes history: DM2 Outpatient Diabetes medications: Glipizide 5 mg daily, Metformin 1000 mg daily Current orders for Inpatient glycemic control: Glipizide 5 mg daily, Novolog 0-9 units TID with meals, Novolog 0-5 units QHS  Inpatient Diabetes Program Recommendations:   HgbA1C: Please consider ordering an A1C. Last A1C in the chart was 7.4% on 07/02/18.  NOTE: Noted initial glucose 440 mg/dl on labs on 12/09/18 and fasting glucose 195 mg/dl this morning. Glipizide was not given on 12/09/18 but ordered to be given this morning. Will follow glucose trends.  Thanks, Barnie Alderman, RN, MSN, CDE Diabetes Coordinator Inpatient Diabetes Program [phone] (Team Pager from 8am to 5pm)

## 2018-12-10 NOTE — Progress Notes (Signed)
[place] [facility] [place] NAME: Darcell Sabino    MR#:  [id number]  DATE OF BIRTH:  February 27, 1918  SUBJECTIVE:  CHIEF COMPLAINT:  No chief complaint on file.  No new c/o. No fevers  REVIEW OF SYSTEMS:  Review of Systems  Constitutional: Negative for chills and fever.  HENT: Negative for hearing loss and tinnitus.   Eyes: Negative for blurred vision and double vision.  Respiratory: Positive for cough. Negative for sputum production.   Cardiovascular: Negative for chest pain and palpitations.  Gastrointestinal: Negative for heartburn and nausea.  Musculoskeletal: Negative for myalgias and neck pain.  Skin: Negative for itching and rash.  Neurological: Negative for dizziness and headaches.  Psychiatric/Behavioral: Negative for depression and substance abuse.    DRUG ALLERGIES:  No Known Allergies VITALS:  Blood pressure 131/81, pulse 70, temperature (!) 97.5 F (36.4 C), temperature source Oral, resp. rate 18, height 6' (1.829 m), weight 81.4 kg, SpO2 100 %. PHYSICAL EXAMINATION:  Physical Exam  GENERAL:  85 y.o.-year-old patient lying in the bed with no acute distress.  EYES: Pupils equal, round, reactive to light and accommodation. No scleral icterus. Extraocular muscles intact.  HEENT: Head atraumatic, normocephalic. Oropharynx and nasopharynx clear.  NECK:  Supple, no jugular venous distention. No thyroid enlargement, no tenderness.  LUNGS: Decreasedl breath sounds bilaterally,bilateral wheezing, rales and rhonchi. No crepitation. No use of accessory muscles of respiration.  CARDIOVASCULAR: S1, S2 normal. No murmurs, rubs, or gallops.  ABDOMEN: Soft, nontender, nondistended. Bowel sounds present. No organomegaly or mass.  EXTREMITIES: Bilateral pedal edema right>left, cyanosis, or clubbing. No rash or lesions. + pedal pulses MUSCULOSKELETAL: Normal bulk, and power was 5+ grip and elbow, knee, and ankle flexion and extension bilaterally.   NEUROLOGIC: Opens her eyes to deep sternal rub.  Very hard of hearing.  Oriented to person and place Gait not tested due to safety concern. PSYCHIATRIC: The patient is alert and oriented x 2.  SKIN: No obvious rash, lesion, or ulcer. LABORATORY PANEL:  Male CBC Recent Labs  Lab 12/10/18 0851  WBC 7.4  HGB 12.5*  HCT 41.0  PLT 145*   ------------------------------------------------------------------------------------------------------------------ Chemistries  Recent Labs  Lab 12/09/18 1040 12/10/18 0851  NA 142 145  K 4.7 3.9  CL 102 103  CO2 27 31  GLUCOSE 440* 222*  BUN 34* 32*  CREATININE 1.85* 1.74*  CALCIUM 9.3 9.3  MG  --  2.7*  AST 35  --   ALT 30  --   ALKPHOS 140*  --   BILITOT 1.9*  --    RADIOLOGY:  Ct Head Wo Contrast  Result Date: 12/09/2018 CLINICAL DATA:  Altered mental status. Patient found on the floor unresponsive. EXAM: CT HEAD WITHOUT CONTRAST TECHNIQUE: Contiguous axial images were obtained from the base of the skull through the vertex without intravenous contrast. COMPARISON:  Brain MRI, 07/02/2018.  Head CT, 07/02/2018. FINDINGS: Brain: No evidence of acute infarction, hemorrhage, hydrocephalus, extra-axial collection or mass lesion/mass effect. There is ventricular and sulcal enlargement reflecting moderate diffuse atrophy. Old left superior basal ganglia to centrum semiovale lacunar infarct. There are patchy areas of periventricular white matter hypoattenuation consistent with mild chronic microvascular ischemic change. Vascular: No hyperdense vessel or unexpected calcification. Skull: Normal. Negative for fracture or focal lesion. Sinuses/Orbits: Globes and orbits are unremarkable. Visualized sinuses and mastoid air cells are clear. Other: None. IMPRESSION: 1. No acute intracranial abnormalities. 2. Atrophy, mild chronic microvascular ischemic change and old deep white matter  lacunar infarct on the left, stable from the prior studies. Electronically  Signed   By: Amie Portlandavid  Ormond M.D.   On: 12/09/2018 18:40   ASSESSMENT AND PLAN:   85 y.o. male with a past medical history of bilateral PE, hyperlipidemia, hypothyroidism, diabetes mellitus, hypertension, syncopal episodes, and anemia presenting to the ED with altered mental status.  1.  Altered mental status ; Likely due to pneumonia. Patient found on the floor unresponsive per wife. - Admit to MedSurg unit - CT head without contrast rule out hemorrhage as he is also on anticoagulation - Improving  2. Acute respiratory failure likely secondary to pneumonia -Supplemental oxygen -DuoNebs every 6 for wheezing -Check procalcitonin -F/U strep and Legionella urine antigen  3. Right lower lobe pneumonia -Chest x-ray shows right lower lobe airspace opacity Follow up blood cultures  -empiric antibiotics with ceftriaxone and azithromycin  4. AKI -likely prerenal Improving -Hold lisinopril -Gentle IV fluids hydration -Continue to monitor renal function  5. History of bilateral PE -Continue Eliquis  6. HTN  + Goal BP <130/80 -Continue amlodipine -Hold lisinopril  7. Diabetes Mellitus Type 2 with complications - Hold metformin + glipizide - CBG monitoring - SSI - DM education and close PCP follow up  8. Hypothyroidism -continue Synthroid  DVT proph: Patient on Eliquis  All the records are reviewed and case discussed with Care Management/Social Worker. Management plans discussed with the patient, family and they are in agreement.  CODE STATUS: DNR  TOTAL TIME TAKING CARE OF THIS PATIENT: 35 minutes.   More than 50% of the time was spent in counseling/coordination of care: YES  POSSIBLE D/C IN 3 DAYS, DEPENDING ON CLINICAL CONDITION.   Karmon Andis M.D on 12/10/2018 at 1:45 PM  Between 7am to 6pm - Pager - [phone]  After 6pm go to [email or web address] - Social research officer, [email or web address] [email or web address]  [facility]  Office  [phone]  CC: Primary care physician;  [name], [name], MD  Note: This dictation was prepared with Dragon dictation along with smaller phrase technology. Any transcriptional errors that result from this process are unintentional.

## 2018-12-10 NOTE — Evaluation (Signed)
Clinical/Bedside Swallow Evaluation Patient Details  Name: Ruben Ruiz MRN: [id number] Date of Birth: July 05, 1917  Today's Date: 12/10/2018 Time: SLP Start Time (ACUTE ONLY): 1315 SLP Stop Time (ACUTE ONLY): 1415 SLP Time Calculation (min) (ACUTE ONLY): 60 min  Past Medical History:  Past Medical History:  Diagnosis Date  . Dementia ([place])   . Diabetes mellitus without complication ([place])   . GERD (gastroesophageal reflux disease)   . HOH (hard of hearing)    AID  . Hypertension    Past Surgical History:  Past Surgical History:  Procedure Laterality Date  . CATARACT EXTRACTION W/PHACO Left 08/29/2017   Procedure: CATARACT EXTRACTION PHACO AND INTRAOCULAR LENS PLACEMENT (IOC);  Surgeon: Leandrew Koyanagi, MD;  Location: [facility] ORS;  Service: Ophthalmology;  Laterality: Left;  [place] 01:45.0 AP% 31.2 CDE 24.15 Fluid Pack Lot # [id number]  . EYE SURGERY     HPI:  Pt is a 85 year old male with a past medical history of Dementia, GERD, HOH, bilateral PE, hyperlipidemia, hypothyroidism, diabetes mellitus, hypertension, syncopal episodes, and anemia presenting to the ED with altered mental status. Per ED reports patient was found unresponsive on the floor per wife. No reports of associated symptoms prior to or following the fall. He has history of syncope however this was not witnessed per wife.  NSG is reporting difficulty chewing the solid foods in his current diet; no deficits noted when drinking liquids. NSG is giving Pills in puree, Crushed.   Assessment / Plan / Recommendation Clinical Impression  Pt presents w/ min oral phase deficits w/ mastication of solids d/t Edentulous status and presents w/ dependency for feeding at meals. Pt also presents w/ fairly consistent Belching when drinking/oral intake and has a baseline of GERD; also advanced age. These circumstances can increase risk for aspiration thus Pulmonary decline from such; especially if aspirating Reflux material that has been  Belched or Regurgitated. Pt is extremely HOH. He required full positioning and support more upright in bed for po trials; feeding of foods, prep of cup for holding to drink. Pt consumed po trials of ice chips, thin liquids, purees, and softened broken-down solids. He utilized a straw while holding the cup to drink. No immediate, overt s/s of aspiration noted; no decline in vocal quality or respiratory status during/post trials. Noted frequent Belching to instruction was given to take Time b/t trials in order to allow for Belching and to coordinated breathing. Of note, when taking multiple sips during one presentation, increased Belching noted followed by throat clearing x2. Oral phase was grossly [facility] for bolus management and clearing of these consistenices. W/ trials of increased textured foods, pt required min more prep time for mashing/gumming of foods - moistening aided breakdown. OM exam appeared grossly WFL during lingual/labial movements. Recommend a Dysphagia level 2 (minced foods diet w/ food moistened well) w/ thin liquids w/ aspiration and REFLUX/GERD precautions; Pills Crushed in Puree as this appears helpful for better swallowing management; Feeding Support at meals w/ pt to Hold Cup to drink. Recommend Dietician f/u for support. Pt appears close to/at his baseline w/ swallowing and need for a more minced foods w/ Pills to be given in Puree - Crushed (as needed).  NSG updated.  SLP Visit Diagnosis: Dysphagia, oral phase (R13.11)(Edentulous status baseline)    Aspiration Risk  Mild aspiration risk(but reduced following precautions)    Diet Recommendation  Dysphagia level 2 (Minced foods moistened well for easier mastication - Edentulous status); thin liquids. Aspiration precautions. REFLUX/GERD precautions. Feeding support at meals - allow  pt to Hold Cup when drinking  Medication Administration: Crushed with puree(as able for better management swallowing)    Other  Recommendations Recommended  Consults: (Dietician f/u; Palliative Care f/u for GOC) Oral Care Recommendations: Oral care BID;Staff/trained caregiver to provide oral care Other Recommendations: (n/a)   Follow up Recommendations None      Frequency and Duration (n/a)  (n/a)       Prognosis Prognosis for Safe Diet Advancement: Fair Barriers to Reach Goals: Cognitive deficits;Time post onset;Severity of deficits(advanced age; GERD)      Swallow Study   General Date of Onset: 12/09/18 HPI: Pt is a 85 year old male with a past medical history of Dementia, GERD, HOH, bilateral PE, hyperlipidemia, hypothyroidism, diabetes mellitus, hypertension, syncopal episodes, and anemia presenting to the ED with altered mental status. Per ED reports patient was found unresponsive on the floor per wife. No reports of associated symptoms prior to or following the fall. He has history of syncope however this was not witnessed per wife.  NSG is reporting difficulty chewing the solid foods in his current diet; no deficits noted when drinking liquids. NSG is giving Pills in puree, Crushed. Type of Study: Bedside Swallow Evaluation Previous Swallow Assessment: none  Diet Prior to this Study: Regular;Thin liquids Temperature Spikes Noted: No(wbc 7.4) Respiratory Status: Nasal cannula(3 liters) History of Recent Intubation: No Behavior/Cognition: Alert;Cooperative;Pleasant mood;Confused;Distractible;Requires cueing(HOH; baseline of Dementia) Oral Cavity Assessment: Within Functional Limits Oral Care Completed by SLP: Recent completion by staff Oral Cavity - Dentition: Edentulous Vision: Functional for self-feeding Self-Feeding Abilities: Able to feed self;Needs assist;Total assist(holds cup to drink) Patient Positioning: Upright in bed(needed full positioning) Baseline Vocal Quality: Low vocal intensity(min decrease in articulation, intelligibility-edentulous) Volitional Cough: Strong Volitional Swallow: Able to elicit     Oral/Motor/Sensory Function Overall Oral Motor/Sensory Function: Within functional limits   Ice Chips Ice chips: Within functional limits Presentation: Spoon(fed; 3 trials)   Thin Liquid Thin Liquid: Within functional limits Presentation: Cup;Self Fed;Straw(in single sips - 10 trials) Other Comments: when taking multiple sips, increased Belching noted followed by throat clearing x2    Nectar Thick Nectar Thick Liquid: Not tested   Honey Thick Honey Thick Liquid: Not tested   Puree Puree: Within functional limits Presentation: Spoon(fed; 5 trials )   Solid     Solid: Impaired(min - edentulous) Presentation: Spoon(fed; 4 trials) Oral Phase Impairments: Impaired mastication(edentulous baseline) Oral Phase Functional Implications: Impaired mastication(min) Pharyngeal Phase Impairments: (none) Other Comments: extra time given        Jerilynn SomKatherine Odesser Tourangeau, MS, CCC-SLP Johnthomas Lader 12/10/2018,3:26 PM

## 2018-12-11 LAB — GLUCOSE, CAPILLARY
Glucose-Capillary: 89 mg/dL (ref 70–99)
Glucose-Capillary: 52 mg/dL — ABNORMAL LOW (ref 70–99)
Glucose-Capillary: 70 mg/dL (ref 70–99)
Glucose-Capillary: 131 mg/dL — ABNORMAL HIGH (ref 70–99)
Glucose-Capillary: 104 mg/dL — ABNORMAL HIGH (ref 70–99)
Glucose-Capillary: 105 mg/dL — ABNORMAL HIGH (ref 70–99)

## 2018-12-11 LAB — PROCALCITONIN: Procalcitonin: <0.1 ng/mL

## 2018-12-11 LAB — CBC
HCT: 37.7 % — ABNORMAL LOW (ref 39.0–52.0)
Hemoglobin: 11.4 g/dL — ABNORMAL LOW (ref 13.0–17.0)
MCH: 25.1 pg — ABNORMAL LOW (ref 26.0–34.0)
MCHC: 30.2 g/dL (ref 30.0–36.0)
MCV: 83 fL (ref 80.0–100.0)
Platelets: 133 10*3/uL — ABNORMAL LOW (ref 150–400)
RBC: 4.54 MIL/uL (ref 4.22–5.81)
RDW: 16.6 % — ABNORMAL HIGH (ref 11.5–15.5)
WBC: 6.3 10*3/uL (ref 4.0–10.5)
nRBC: 0 % (ref 0.0–0.2)

## 2018-12-11 LAB — BASIC METABOLIC PANEL
Anion gap: 9 (ref 5–15)
BUN: 31 mg/dL — ABNORMAL HIGH (ref 8–23)
CO2: 30 mmol/L (ref 22–32)
Calcium: 8.9 mg/dL (ref 8.9–10.3)
Chloride: 107 mmol/L (ref 98–111)
Creatinine, Ser: 1.49 mg/dL — ABNORMAL HIGH (ref 0.61–1.24)
GFR calc Af Amer: 44 mL/min — ABNORMAL LOW (ref >60)
GFR calc non Af Amer: 38 mL/min — ABNORMAL LOW (ref >60)
Glucose, Bld: 117 mg/dL — ABNORMAL HIGH (ref 70–99)
Potassium: 3.7 mmol/L (ref 3.5–5.1)
Sodium: 146 mmol/L — ABNORMAL HIGH (ref 135–145)

## 2018-12-11 LAB — MAGNESIUM: Magnesium: 2.5 mg/dL — ABNORMAL HIGH (ref 1.7–2.4)

## 2018-12-11 LAB — LEGIONELLA PNEUMOPHILA SEROGP 1 UR AG: L. pneumophila Serogp 1 Ur Ag: NEGATIVE

## 2018-12-11 MED ORDER — FUROSEMIDE 10 MG/ML IJ SOLN
40.0000 mg | Freq: Two times a day (BID) | INTRAMUSCULAR | Status: DC
  Administered 2018-12-11 – 2018-12-16 (×11): 40 mg via INTRAVENOUS
  Filled 2018-12-11 (×11): qty 4

## 2018-12-11 NOTE — Significant Event (Signed)
Rapid Response Event Note  Overview: Time Called: 1329 Arrival Time: 1330 Event Type: Neurologic, Respiratory  Initial Focused Assessment: called for rapid response for increased lethargy, decreased 02 sats   Interventions: pt had evidently taken 02 off, replaced 02. Sats 94%, Dr Ulysees Barns made aware  Plan of Care (if not transferred): 2A RN to call if further assistance needed.  Event Summary: Name of Physician Notified: Ulysees Barns at 1352    at    Outcome: Stayed in room and stabalized  Event End Time: [place]

## 2018-12-11 NOTE — Progress Notes (Signed)
[facility] [place] [facility]   PATIENT NAME: Ruben Ruiz    MR#:  [id number]  DATE OF BIRTH:  09-05-1917  SUBJECTIVE:  CHIEF COMPLAINT:  No chief complaint on file.  Patient eating at the time of my evaluation his wife is at bedside  REVIEW OF SYSTEMS:  Unable to provide  DRUG ALLERGIES:  No Known Allergies VITALS:  Blood pressure 132/90, pulse 86, temperature 97.9 F (36.6 C), temperature source Oral, resp. rate (!) 22, height 6' (1.829 m), weight 81.4 kg, SpO2 93 %. PHYSICAL EXAMINATION:  Physical Exam  GENERAL:  85 y.o.-year-old patient lying in the bed with no acute distress.  EYES: Pupils equal, round, reactive to light and accommodation. No scleral icterus. Extraocular muscles intact.  HEENT: Head atraumatic, normocephalic. Oropharynx and nasopharynx clear.  NECK:  Supple, no jugular venous distention. No thyroid enlargement, no tenderness.  LUNGS: Decreasedl breath sounds bilaterally,bilateral wheezing, rales and rhonchi. No crepitation. No use of accessory muscles of respiration.  CARDIOVASCULAR: S1, S2 normal. No murmurs, rubs, or gallops.  ABDOMEN: Soft, nontender, nondistended. Bowel sounds present. No organomegaly or mass.  EXTREMITIES:  2+ bilateral pedal edema right>left, cyanosis, or clubbing. No rash or lesions. + pedal pulses MUSCULOSKELETAL: Normal bulk, and power was 5+ grip and elbow, knee, and ankle flexion and extension bilaterally.  NEUROLOGIC:  Patient is awake not able to follow complete commands  pSYCHIATRIC: The patient is alert able to answer questions SKIN: No obvious rash, lesion, or ulcer. LABORATORY PANEL:  Male CBC Recent Labs  Lab 12/11/18 0628  WBC 6.3  HGB 11.4*  HCT 37.7*  PLT 133*   ------------------------------------------------------------------------------------------------------------------ Chemistries  Recent Labs  Lab 12/09/18 1040  12/11/18 0628  NA 142   < > 146*  K 4.7   < > 3.7  CL 102    < > 107  CO2 27   < > 30  GLUCOSE 440*   < > 117*  BUN 34*   < > 31*  CREATININE 1.85*   < > 1.49*  CALCIUM 9.3   < > 8.9  MG  --    < > 2.5*  AST 35  --   --   ALT 30  --   --   ALKPHOS 140*  --   --   BILITOT 1.9*  --   --    < > = values in this interval not displayed.   RADIOLOGY:  No results found. ASSESSMENT AND PLAN:   85 y.o. male with a past medical history of bilateral PE, hyperlipidemia, hypothyroidism, diabetes mellitus, hypertension, syncopal episodes, and anemia presenting to the ED with altered mental status.  1.  Altered mental status ; Likely due to pneumonia. Patient found on the floor unresponsive per wife. -Improved with treatment of antibiotics   2. Acute respiratory failure likely secondary to pneumonia -Supplemental oxygen -DuoNebs every 6 for wheezing   3. Right lower lobe pneumonia likely aspiration -Chest x-ray shows right lower lobe airspace opacity Continue current antibiotic  4. AKI  patient fluid overloaded I will discontinue IV fluids place him on IV Lasix check echocardiogram of the heart  5. History of bilateral PE -Continue Eliquis  6. HTN  + Goal BP <130/80 -Continue amlodipine -Hold lisinopril  7. Diabetes Mellitus Type 2 with complications - Hold metformin + glipizide - CBG monitoring - SSI - DM education and close PCP follow up  8. Hypothyroidism -continue Synthroid  DVT proph: Patient on Eliquis  All the  records are reviewed and case discussed with Care Management/Social Worker. Management plans discussed with the patient, family and they are in agreement.  CODE STATUS: DNR  TOTAL TIME TAKING CARE OF THIS PATIENT: 35 minutes.   More than 50% of the time was spent in counseling/coordination of care: YES  POSSIBLE D/C IN 3 DAYS, DEPENDING ON CLINICAL CONDITION.   Ruben Ruiz M.D on 12/11/2018 at 3:05 PM  Between 7am to 6pm - Pager - [phone]  After 6pm go to [email or web address] - Proofreader   [facility]  Office  [phone]  CC: Primary care physician; Denton Lank, MD  Note: This dictation was prepared with Dragon dictation along with smaller phrase technology. Any transcriptional errors that result from this process are unintentional.

## 2018-12-11 NOTE — Progress Notes (Signed)
PT Cancellation Note  Patient Details Name: Ruben Ruiz MRN: [id number] DOB: August 19, 1917   Cancelled Treatment:    Reason Eval/Treat Not Completed: Medical issues which prohibited therapy(Consult received and chart reviewed. Per chart review and discussion with primary RN, patient recently stabilized after rapid response episode (respiratory decline).  Recommends hold until next date.  Will continue to follow and re-attempt next date as medically appropriate.)   [name] H. Owens Shark, PT, DPT, NCS 12/11/18, 2:56 PM [phone]

## 2018-12-12 LAB — GLUCOSE, CAPILLARY
Glucose-Capillary: 71 mg/dL (ref 70–99)
Glucose-Capillary: 145 mg/dL — ABNORMAL HIGH (ref 70–99)
Glucose-Capillary: 145 mg/dL — ABNORMAL HIGH (ref 70–99)
Glucose-Capillary: 183 mg/dL — ABNORMAL HIGH (ref 70–99)

## 2018-12-12 NOTE — Evaluation (Signed)
Physical Therapy Evaluation Patient Details Name: Ruben Ruiz MRN: [id number] DOB: 04/20/1918 Today's Date: 12/12/2018   History of Present Illness  presented to ER after being found down, unresponsive, AMS; admitted for management of acute respiratory failure related to R LL PNA, R pleural effusion.  Clinical Impression  Patient resting in bed upon arrival to room, visiting with wife at bedside.  Alert and oriented to basic information, follows simple commands; significantly HOH.  Generally weak and deconditioned, requiring extensive physical assist for bed mobility and unsupported sitting balance (mod assist).  Moderate SOB, Desat to 84% with transition to upright, requiring return to supine and increase to 4L (from 2L) for recovery >90%.  Unsafe/unable to attempt OOB or gait at this time due to fatigue/desat.  Will continue to assess/progress as medically appropriate. Would benefit from skilled PT to address above deficits and promote optimal return to PLOF; recommend transition to STR upon discharge from acute hospitalization.     Follow Up Recommendations SNF    Equipment Recommendations       Recommendations for Other Services       Precautions / Restrictions Precautions Precautions: Fall Restrictions Weight Bearing Restrictions: No      Mobility  Bed Mobility Overal bed mobility: Needs Assistance Bed Mobility: Supine to Sit     Supine to sit: Mod assist     General bed mobility comments: for truncal elevation and control  Transfers                 General transfer comment: mod assist for unsupported sitting; lists posterior with fatigue, unable to self-correct.  Desat to 84% with transition to upright, requiring return to supine and increase to 4L for recovery >90%  Ambulation/Gait             General Gait Details: unsafe/unable  Stairs            Wheelchair Mobility    Modified Rankin (Stroke Patients Only)       Balance Overall  balance assessment: Needs assistance Sitting-balance support: No upper extremity supported;Feet supported Sitting balance-Leahy Scale: Poor Sitting balance - Comments: lists posteriorally, unable to self-correct Postural control: Posterior lean                                   Pertinent Vitals/Pain Pain Assessment: No/denies pain    Home Living Family/patient expects to be discharged to:: Private residence   Available Help at Discharge: Family Type of Home: House Home Access: Ramped entrance     Home Layout: Two level;Able to live on main level with bedroom/bathroom Home Equipment: None      Prior Function           Comments: Mod indep with RW for ADLs, household mobility; wife endorses progressive decline in recent weeks, 2-3 falls/3 months     Hand Dominance        Extremity/Trunk Assessment   Upper Extremity Assessment Upper Extremity Assessment: Generalized weakness    Lower Extremity Assessment Lower Extremity Assessment: Generalized weakness(4-/5 throughout)       Communication   Communication: HOH  Cognition Arousal/Alertness: Awake/alert Behavior During Therapy: WFL for tasks assessed/performed Overall Cognitive Status: Within Functional Limits for tasks assessed  General Comments      Exercises Other Exercises Other Exercises: Educated in pursed lip breathing, positioning and postural extension to aid with O2 support; will reinforce as appropriate Other Exercises: Attempted to integrate sitting balacne/midline orientation activities; unable to tolerate due to SOB, desat with minimal activity.   Assessment/Plan    PT Assessment Patient needs continued PT services  PT Problem List Decreased strength;Decreased range of motion;Decreased activity tolerance;Decreased balance;Decreased mobility;Decreased coordination;Decreased cognition;Decreased knowledge of use of DME;Decreased  safety awareness;Decreased knowledge of precautions;Cardiopulmonary status limiting activity       PT Treatment Interventions Gait training;Therapeutic activities;Functional mobility training;Therapeutic exercise;DME instruction;Balance training;Patient/family education    PT Goals (Current goals can be found in the Care Plan section)  Acute Rehab PT Goals Patient Stated Goal: per wife, interested in STR PT Goal Formulation: With patient/family Time For Goal Achievement: 12/26/18 Potential to Achieve Goals: Fair    Frequency Min 2X/week   Barriers to discharge Decreased caregiver support      Co-evaluation               AM-PAC PT "6 Clicks" Mobility  Outcome Measure Help needed turning from your back to your side while in a flat bed without using bedrails?: A Lot Help needed moving from lying on your back to sitting on the side of a flat bed without using bedrails?: A Lot Help needed moving to and from a bed to a chair (including a wheelchair)?: Total Help needed standing up from a chair using your arms (e.g., wheelchair or bedside chair)?: Total Help needed to walk in hospital room?: Total Help needed climbing 3-5 steps with a railing? : Total 6 Click Score: 8    End of Session Equipment Utilized During Treatment: Oxygen Activity Tolerance: Patient limited by fatigue;Treatment limited secondary to medical complications (Comment)(SOB, desat with minimal exertion) Patient left: in bed;with bed alarm set;with family/visitor present;with call bell/phone within reach Nurse Communication: Mobility status PT Visit Diagnosis: Difficulty in walking, not elsewhere classified (R26.2);Muscle weakness (generalized) (M62.81)    Time: 1343-1410 PT Time Calculation (min) (ACUTE ONLY): 27 min   Charges:   PT Evaluation $PT Eval Moderate Complexity: 1 Mod PT Treatments $Therapeutic Activity: 8-22 mins       Georganna Maxson H. Owens Shark, PT, DPT, NCS 12/12/18, 5:05 PM [phone]

## 2018-12-12 NOTE — Care Management Important Message (Signed)
Important Message  Patient Details  Name: Ruben Ruiz MRN: [id number] Date of Birth: 10-Jun-1917   Medicare Important Message Given:  Yes     [name] 12/12/2018, 1:48 PM

## 2018-12-12 NOTE — Progress Notes (Signed)
[name] [facility] [place] NAME: Ruben Ruiz    MR#:  [id number]  DATE OF BIRTH:  1917/10/09  SUBJECTIVE:  CHIEF COMPLAINT:  No chief complaint on file.  Patient gets very short of breath on 4 L of oxygen  REVIEW OF SYSTEMS:  Unable to provide  DRUG ALLERGIES:  No Known Allergies VITALS:  Blood pressure (!) 139/91, pulse 82, temperature 98 F (36.7 C), temperature source Oral, resp. rate 20, height 6' (1.829 m), weight 81.4 kg, SpO2 98 %. PHYSICAL EXAMINATION:  Physical Exam  GENERAL:  85 y.o.-year-old patient lying in the bed with no acute distress.  EYES: Pupils equal, round, reactive to light and accommodation. No scleral icterus. Extraocular muscles intact.  HEENT: Head atraumatic, normocephalic. Oropharynx and nasopharynx clear.  NECK:  Supple, no jugular venous distention. No thyroid enlargement, no tenderness.  LUNGS: Decreasedl breath sounds bilaterally,bilateral wheezing, rales and rhonchi. No crepitation. No use of accessory muscles of respiration.  CARDIOVASCULAR: S1, S2 normal. No murmurs, rubs, or gallops.  ABDOMEN: Soft, nontender, nondistended. Bowel sounds present. No organomegaly or mass.  EXTREMITIES:  2+ bilateral pedal edema right>left, cyanosis, or clubbing. No rash or lesions. + pedal pulses MUSCULOSKELETAL: Normal bulk, and power was 5+ grip and elbow, knee, and ankle flexion and extension bilaterally.  NEUROLOGIC:  Patient is awake not able to follow complete commands  pSYCHIATRIC: The patient is alert able to answer questions SKIN: No obvious rash, lesion, or ulcer. LABORATORY PANEL:  Male CBC Recent Labs  Lab 12/11/18 0628  WBC 6.3  HGB 11.4*  HCT 37.7*  PLT 133*   ------------------------------------------------------------------------------------------------------------------ Chemistries  Recent Labs  Lab 12/09/18 1040  12/11/18 0628  NA 142   < > 146*  K 4.7   < > 3.7  CL 102   < > 107  CO2 27    < > 30  GLUCOSE 440*   < > 117*  BUN 34*   < > 31*  CREATININE 1.85*   < > 1.49*  CALCIUM 9.3   < > 8.9  MG  --    < > 2.5*  AST 35  --   --   ALT 30  --   --   ALKPHOS 140*  --   --   BILITOT 1.9*  --   --    < > = values in this interval not displayed.   RADIOLOGY:  No results found. ASSESSMENT AND PLAN:   85 y.o. male with a past medical history of bilateral PE, hyperlipidemia, hypothyroidism, diabetes mellitus, hypertension, syncopal episodes, and anemia presenting to the ED with altered mental status.  1.  Altered mental status ; Likely due to pneumonia. Patient found on the floor unresponsive per wife. -Patient currently stable   2. Acute respiratory failure likely secondary to pneumonia -Supplemental oxygen -DuoNebs every 6 for wheezing Still on high flow oxygen  3. Right lower lobe pneumonia likely aspiration -Chest x-ray shows right lower lobe airspace opacity Continue current antibiotic  4. AKI  repeat BMP in the morning  5. History of bilateral PE -Continue Eliquis  6. HTN  + Goal BP <130/80 -Continue amlodipine -Hold lisinopril  7. Diabetes Mellitus Type 2 with complications - Hold metformin + glipizide - CBG monitoring - SSI - DM education and close PCP follow up  8. Hypothyroidism -continue Synthroid  DVT proph: Patient on Eliquis  All the records are reviewed and case discussed with Care Management/Social Worker. Management plans discussed  with the patient, family and they are in agreement.  CODE STATUS: DNR  TOTAL TIME TAKING CARE OF THIS PATIENT: 35 minutes.   More than 50% of the time was spent in counseling/coordination of care: YES  POSSIBLE D/C IN 3 DAYS, DEPENDING ON CLINICAL CONDITION.   Auburn BilberryShreyang Eriverto Byrnes M.D on 12/12/2018 at 2:32 PM  Between 7am to 6pm - Pager - [phone]  After 6pm go to [email or web address] - Social research officer, governmentpassword [email or web address] [email or web address]  [facility] [place] [facility]  Office  [phone]  CC: Primary care physician;  [name], [name], MD  Note: This dictation was prepared with Dragon dictation along with smaller phrase technology. Any transcriptional errors that result from this process are unintentional.

## 2018-12-13 ENCOUNTER — Inpatient Hospital Stay: Payer: Medicare Other

## 2018-12-13 LAB — CBC
HCT: 40.3 % (ref 39.0–52.0)
Hemoglobin: 12.1 g/dL — ABNORMAL LOW (ref 13.0–17.0)
MCH: 24.6 pg — ABNORMAL LOW (ref 26.0–34.0)
MCHC: 30 g/dL (ref 30.0–36.0)
MCV: 81.9 fL (ref 80.0–100.0)
Platelets: 149 10*3/uL — ABNORMAL LOW (ref 150–400)
RBC: 4.92 MIL/uL (ref 4.22–5.81)
RDW: 16.5 % — ABNORMAL HIGH (ref 11.5–15.5)
WBC: 7 10*3/uL (ref 4.0–10.5)
nRBC: 0 % (ref 0.0–0.2)

## 2018-12-13 LAB — BASIC METABOLIC PANEL
Anion gap: 10 (ref 5–15)
BUN: 28 mg/dL — ABNORMAL HIGH (ref 8–23)
CO2: 33 mmol/L — ABNORMAL HIGH (ref 22–32)
Calcium: 9 mg/dL (ref 8.9–10.3)
Chloride: 101 mmol/L (ref 98–111)
Creatinine, Ser: 1.32 mg/dL — ABNORMAL HIGH (ref 0.61–1.24)
GFR calc Af Amer: 51 mL/min — ABNORMAL LOW (ref >60)
GFR calc non Af Amer: 44 mL/min — ABNORMAL LOW (ref >60)
Glucose, Bld: 110 mg/dL — ABNORMAL HIGH (ref 70–99)
Potassium: 3.8 mmol/L (ref 3.5–5.1)
Sodium: 144 mmol/L (ref 135–145)

## 2018-12-13 LAB — GLUCOSE, CAPILLARY
Glucose-Capillary: 102 mg/dL — ABNORMAL HIGH (ref 70–99)
Glucose-Capillary: 131 mg/dL — ABNORMAL HIGH (ref 70–99)
Glucose-Capillary: 214 mg/dL — ABNORMAL HIGH (ref 70–99)

## 2018-12-13 MED ORDER — CEFUROXIME AXETIL 500 MG PO TABS
500.0000 mg | ORAL_TABLET | Freq: Two times a day (BID) | ORAL | Status: DC
  Administered 2018-12-13 – 2018-12-16 (×7): 500 mg via ORAL
  Filled 2018-12-13 (×7): qty 1

## 2018-12-13 NOTE — Progress Notes (Signed)
  Speech Language Pathology Treatment: Dysphagia  Patient Details Name: Ruben Ruiz MRN: [id number] DOB: Jun 16, 1917 Today's Date: 12/13/2018 Time: 1330-1410 SLP Time Calculation (min) (ACUTE ONLY): 40 min  Assessment / Plan / Recommendation Clinical Impression  Pt seen for ongoing assessment and toleration of diet; a Dysphagia level 2 (MINCED foods) diet was recommended secondary to pt's Edentulous status and increased oral phase/mastication effort attempting to gum/mash solid foods. Pt also has some difficulty w/ self-feeding w/ utensils but w/ support is able to manage. He does appear to fatigue w/ the exertion of eating/meal tasks and benefits from feeding support from others. Pt is quite HOH.  Pt consumed po trials of thin liquids via straw and boluses of puree and MINCED foods w/ no immediate, overt s/s of aspiration noted; no decline in vocal quality or respiratory status during/pos trials. Oral phase was [facility] for bolus management and oral clearing post trials of Purees and liquids; increased oral phase time/gumming noted w/ the Minced foods. He appeared to exhibit fatigue w/ the demand and exertion of energy w/ po tasks, and when eating foods of increased texture. At times, he utilized mouth breathing to catch his breath. Frequent rest breaks given - noted trials of Puree foods were less demanding during the oral phase and cleared easily. Pt consumed ALL of the Puree foods on lunch tray. Due to pt's overall medical and respiratory presentations(including impact from CHF), recommend continue a more Minced foods diet w/ Puree meats, added gravy (and more Puree foods at meals) for conservation of energy, thin liquids; aspiration precautions; assistance w/ feeding at meals; positioning upright for oral intake; REFLUX precautions in light of GERD baseline. Recommend Dietician f/u for support; Pills given in Puree for safer swallowing overall. ST services can be reconsulted if decline in swallowing  status while admitted. Precautions posted. NSG updated and agreed.    HPI HPI: Pt is a 85 year old male with a past medical history of Dementia, GERD, HOH, bilateral PE, hyperlipidemia, hypothyroidism, diabetes mellitus, hypertension, syncopal episodes, and anemia presenting to the ED with altered mental status. Per ED reports patient was found unresponsive on the floor per wife. No reports of associated symptoms prior to or following the fall. He has history of syncope however this was not witnessed per wife.  NSG is reporting difficulty chewing the solid foods in his current diet; no deficits noted when drinking liquids. NSG is giving Pills in puree, Crushed.      SLP Plan  Continue with current plan of care       Recommendations  Diet recommendations: Dysphagia 2 (fine chop);Thin liquid(w/ added Purees) Liquids provided via: Cup;Straw Medication Administration: Crushed with puree(for easier swallowing here and at home) Supervision: Staff to assist with self feeding;Intermittent supervision to cue for compensatory strategies Compensations: Minimize environmental distractions;Slow rate;Small sips/bites;Lingual sweep for clearance of pocketing;Follow solids with liquid Postural Changes and/or Swallow Maneuvers: Seated upright 90 degrees;Upright 30-60 min after meal                General recommendations: (Dietician f/u as needed) Oral Care Recommendations: Oral care BID;Staff/trained caregiver to provide oral care Follow up Recommendations: None SLP Visit Diagnosis: Dysphagia, oral phase (R13.11)((Edentulous status); advanced age) Plan: Continue with current plan of care       [place], [place], CCC-SLP Marlisha Vanwyk 12/13/2018, 3:03 PM

## 2018-12-13 NOTE — TOC Initial Note (Addendum)
Transition of Care [facility]) - Initial/Assessment Note    Patient Details  Name: Ruben Ruiz MRN: [id number] Date of Birth: May 22, 1917  [facility] of [facility]) CM/SW Contact:    Ross Ludwig, LCSW Phone Number: 12/13/2018, 5:12 PM  Clinical Narrative:                  Patient is a 85 year old male who is alert and oriented x2.  Patient's wife was at bedside, patient's wife states he has never been to rehab before.  CSW explained to patient and his wife what to expect SNF and the process for finding placement.  CSW how insurance will pay for stay along with process of discharging from hospital.  CSW was given permission to begin bed search in [facility] for SNF.  Expected Discharge Plan: Skilled Nursing Facility Barriers to Discharge: Continued Medical Work up   Patient Goals and CMS Choice Patient states their goals for this hospitalization and ongoing recovery are:: To go to SNF for short term rehab, then return back home. CMS Medicare.gov Compare Post Acute Care list provided to:: Patient Represenative (must comment) Choice offered to / list presented to : [facility] POA / Guardian, Spouse, Patient  Expected Discharge Plan and Services Expected Discharge Plan: [place] In-house Referral: Clinical Social Work   Post Acute Care Choice: [place] Living arrangements for the past 2 months: Single Family Home                 DME Arranged: N/A DME Agency: NA                  Prior Living Arrangements/Services Living arrangements for the past 2 months: Single Family Home Lives with:: Spouse Patient language and need for interpreter reviewed:: Yes Do you feel safe going back to the place where you live?: No   Patient and wife feel that he needs some short term rhab before returning back home.  Need for Family Participation in Patient Care: Yes (Comment) Care giver support system in place?: No (comment)   Criminal Activity/Legal Involvement  Pertinent to Current Situation/Hospitalization: No - Comment as needed  Activities of Daily Living Home Assistive Devices/Equipment: Dentures (specify type), Eyeglasses ADL Screening (condition at time of admission) Patient's cognitive ability adequate to safely complete daily activities?: No Is the patient deaf or have difficulty hearing?: Yes Does the patient have difficulty seeing, even when wearing glasses/contacts?: Yes Does the patient have difficulty concentrating, remembering, or making decisions?: Yes Patient able to express need for assistance with ADLs?: No Does the patient have difficulty dressing or bathing?: Yes Independently performs ADLs?: No Does the patient have difficulty walking or climbing stairs?: Yes Weakness of Legs: Both Weakness of Arms/Hands: Both  Permission Sought/Granted Permission sought to share information with : Facility Sport and exercise psychologist, Family Supports, Case Manager Permission granted to share information with : Yes, Verbal Permission Granted  Share Information with NAME: Ruben, Ruiz [phone]  [phone]  Permission granted to share info w AGENCY: SNF admissions        Emotional Assessment Appearance:: Appears stated age Attitude/Demeanor/Rapport: Engaged Affect (typically observed): Accepting, Grieving, Calm, Pleasant, Quiet, Stable Orientation: : Oriented to Self, Oriented to Place Alcohol / Substance Use: Not Applicable Psych Involvement: No (comment)  Admission diagnosis:  Community acquired pneumonia of right lower lobe of lung ([place]) [J18.1] Patient Active Problem List   Diagnosis Date Noted  . Pneumonia 12/09/2018  . Elevated troponin 07/02/2018  . AKI (acute kidney  injury) (HCC) 04/15/2018  . Hypertensive urgency 06/20/2015  . Hypoglycemia associated with diabetes (HCC) 06/20/2015  . Anemia 06/20/2015  . Syncope 06/18/2015   PCP:  [name], [name], MD [facility] [place] [address] [place] [address] Phone: [phone] Fax: [phone]     Social Determinants of Health (SDOH) Interventions    Readmission Risk Interventions No flowsheet data found.

## 2018-12-13 NOTE — NC FL2 (Signed)
[place] LEVEL OF CARE SCREENING TOOL     IDENTIFICATION  Patient Name: Ruben Ruiz Birthdate: [birth year] Sex: male Admission Date (Current Location): 12/09/2018  [place] and [place] Number:  Selena Lesser [phone] [place] and Address:  [facility], [address] [place], [place] [address]      Provider Number: [id number]  Attending Physician Name and Address:  Dustin Flock, MD  Relative Name and Phone Number:  Vaughan, Garfinkle Spouse [phone]  [phone]    Current Level of Care: Hospital Recommended Level of Care: [place] Prior Approval Number:    Date Approved/Denied:   PASRR Number: [id number]  Discharge Plan: SNF    Current Diagnoses: Patient Active Problem List   Diagnosis Date Noted  . Pneumonia 12/09/2018  . Elevated troponin 07/02/2018  . AKI (acute kidney injury) ([place]) 04/15/2018  . Hypertensive urgency 06/20/2015  . Hypoglycemia associated with diabetes ([place]) 06/20/2015  . Anemia 06/20/2015  . Syncope 06/18/2015    Orientation RESPIRATION BLADDER Height & Weight     Self, Place  O2(2L Continous oxygen) Incontinent Weight: (can't get pt wt, lift not working) Height:  6' (182.9 cm)  BEHAVIORAL SYMPTOMS/MOOD NEUROLOGICAL BOWEL NUTRITION STATUS      Continent Diet(Carb modified)  AMBULATORY STATUS COMMUNICATION OF NEEDS Skin   Limited Assist Verbally Normal                       Personal Care Assistance Level of Assistance  Bathing, Feeding, Dressing Bathing Assistance: Limited assistance Feeding assistance: Limited assistance Dressing Assistance: Limited assistance     Functional Limitations Info  Sight, Speech, Hearing Sight Info: Adequate Hearing Info: Impaired Speech Info: Adequate    SPECIAL CARE FACTORS FREQUENCY  PT (By licensed PT), OT (By licensed OT)     PT Frequency: Minimum 5x a week OT Frequency: Minimum 5x a week            Contractures  Contractures Info: Not present    Additional Factors Info  Code Status, Allergies, Insulin Sliding Scale Code Status Info: DNR Allergies Info: No Known Allergies   Insulin Sliding Scale Info: insulin aspart (novoLOG) injection 0-9 Units 3x a day with meals       Current Medications (12/13/2018):  This is the current hospital active medication list Current Facility-Administered Medications  Medication Dose Route Frequency Provider Last Rate Last Dose  . amLODipine (NORVASC) tablet 5 mg  5 mg Oral Daily Lang Snow, NP   5 mg at 12/13/18 1019  . apixaban (ELIQUIS) tablet 5 mg  5 mg Oral BID Lang Snow, NP   5 mg at 12/13/18 1019  . aspirin EC tablet 81 mg  81 mg Oral Daily Lang Snow, NP   81 mg at 12/13/18 1024  . cefUROXime (CEFTIN) tablet 500 mg  500 mg Oral BID WC Dustin Flock, MD      . docusate sodium (COLACE) capsule 100 mg  100 mg Oral BID Lang Snow, NP   100 mg at 12/13/18 1019  . furosemide (LASIX) injection 40 mg  40 mg Intravenous Q12H Dustin Flock, MD   40 mg at 12/13/18 1346  . insulin aspart (novoLOG) injection 0-5 Units  0-5 Units Subcutaneous QHS Lang Snow, NP   4 Units at 12/09/18 2119  . insulin aspart (novoLOG) injection 0-9 Units  0-9 Units Subcutaneous TID WC Lang Snow, NP   1 Units at 12/13/18 1346  . ipratropium-albuterol (DUONEB) 0.5-2.5 (3)  MG/3ML nebulizer solution 3 mL  3 mL Nebulization Q6H Jimmye Normanuma, Elizabeth Achieng, NP   3 mL at 12/13/18 1429  . levothyroxine (SYNTHROID) tablet 50 mcg  50 mcg Oral QAC breakfast Jimmye Normanuma, Elizabeth Achieng, NP   50 mcg at 12/13/18 [id number]  . pantoprazole (PROTONIX) EC tablet 40 mg  40 mg Oral Daily Jimmye Normanuma, Elizabeth Achieng, NP   40 mg at 12/13/18 1019  . pravastatin (PRAVACHOL) tablet 20 mg  20 mg Oral QHS Jimmye Normanuma, Elizabeth Achieng, NP   20 mg at 12/12/18 2138  . triamcinolone cream (KENALOG) 0.1 % 1 application  1 application Topical BID PRN Jimmye Normanuma, Elizabeth  Achieng, NP         Discharge Medications: Please see discharge summary for a list of discharge medications.  Relevant Imaging Results:  Relevant Lab Results:   Additional Information SSN [id number]  Darleene Cleavernterhaus, Jaydrian Corpening R, LCSW

## 2018-12-13 NOTE — Progress Notes (Signed)
[employer] [facility] [place] [facility]   PATIENT NAME: Ruben Ruiz    MR#:  [id number]  DATE OF BIRTH:  03-18-1918  SUBJECTIVE:  CHIEF COMPLAINT:  No chief complaint on file.  Patient diuresing well wife at bedside  REVIEW OF SYSTEMS:  Unable to provide due to memory issues  DRUG ALLERGIES:  No Known Allergies VITALS:  Blood pressure 132/83, pulse 79, temperature 98.1 F (36.7 C), temperature source Oral, resp. rate 18, height 6' (1.829 m), weight 81.4 kg, SpO2 97 %. PHYSICAL EXAMINATION:  Physical Exam  GENERAL:  85 y.o.-year-old patient lying in the bed with no acute distress.  EYES: Pupils equal, round, reactive to light and accommodation. No scleral icterus. Extraocular muscles intact.  HEENT: Head atraumatic, normocephalic. Oropharynx and nasopharynx clear.  NECK:  Supple, no jugular venous distention. No thyroid enlargement, no tenderness.  LUNGS: Decreasedl breath sounds bilaterally,bilateral wheezing, rales and rhonchi. No crepitation. No use of accessory muscles of respiration.  CARDIOVASCULAR: S1, S2 normal. No murmurs, rubs, or gallops.  ABDOMEN: Soft, nontender, nondistended. Bowel sounds present. No organomegaly or mass.  EXTREMITIES:  2+ bilateral pedal edema right>left, cyanosis, or clubbing. No rash or lesions. + pedal pulses MUSCULOSKELETAL: Normal bulk, and power was 5+ grip and elbow, knee, and ankle flexion and extension bilaterally.  NEUROLOGIC:  Patient is awake able to follow commands pSYCHIATRIC: The patient is alert able to answer questions SKIN: No obvious rash, lesion, or ulcer. LABORATORY PANEL:  Male CBC Recent Labs  Lab 12/13/18 0724  WBC 7.0  HGB 12.1*  HCT 40.3  PLT 149*   ------------------------------------------------------------------------------------------------------------------ Chemistries  Recent Labs  Lab 12/09/18 1040  12/11/18 0628 12/13/18 0724  NA 142   < > 146* 144  K 4.7   < > 3.7 3.8  CL 102   <  > 107 101  CO2 27   < > 30 33*  GLUCOSE 440*   < > 117* 110*  BUN 34*   < > 31* 28*  CREATININE 1.85*   < > 1.49* 1.32*  CALCIUM 9.3   < > 8.9 9.0  MG  --    < > 2.5*  --   AST 35  --   --   --   ALT 30  --   --   --   ALKPHOS 140*  --   --   --   BILITOT 1.9*  --   --   --    < > = values in this interval not displayed.   RADIOLOGY:  Dg Chest Port 1 View  Result Date: 12/13/2018 CLINICAL DATA:  Shortness of breath EXAM: PORTABLE CHEST 1 VIEW COMPARISON:  12/09/2018 FINDINGS: Cardiac shadow is stable. Aortic calcifications are again seen. Left lung remains clear. Small right-sided pleural effusion and basilar opacity is noted stable from the prior exam. IMPRESSION: Stable infiltrate and effusion in the right base. Electronically Signed   By: Alcide CleverMark  Lukens M.D.   On: 12/13/2018 08:59   ASSESSMENT AND PLAN:   85 y.o. male with a past medical history of bilateral PE, hyperlipidemia, hypothyroidism, diabetes mellitus, hypertension, syncopal episodes, and anemia presenting to the ED with altered mental status.  1.  Altered mental status ; multifactorial likely due to pneumonia. Patient found on the floor unresponsive per wife. -Patient currently stable   2. Acute respiratory failure likely secondary to pneumonia -Supplemental oxygen -DuoNebs every 6 for wheezing Patient diuresing well  3. Right lower lobe pneumonia likely aspiration -Chest  x-ray shows right lower lobe airspace opacity Change to oral antibiotic  4. AKI  repeat BMP in the morning  5. History of bilateral PE -Continue Eliquis  6. HTN  + Goal BP <130/80 -Continue amlodipine -Hold lisinopril  7. Diabetes Mellitus Type 2 with complications - Hold metformin + glipizide - CBG monitoring - SSI - DM education and close PCP follow up  8. Hypothyroidism -continue Synthroid  DVT proph: Patient on Eliquis  All the records are reviewed and case discussed with Care Management/Social Worker. Management plans  discussed with the patient, family and they are in agreement.  CODE STATUS: DNR  TOTAL TIME TAKING CARE OF THIS PATIENT: 35 minutes.   More than 50% of the time was spent in counseling/coordination of care: YES  POSSIBLE D/C IN 3 DAYS, DEPENDING ON CLINICAL CONDITION.   Dustin Flock M.D on 12/13/2018 at 2:49 PM  Between 7am to 6pm - Pager - [phone]  After 6pm go to [email or web address] - Proofreader  [facility] [place] [facility]  Office  [phone]  CC: Primary care physician; Denton Lank, MD  Note: This dictation was prepared with Dragon dictation along with smaller phrase technology. Any transcriptional errors that result from this process are unintentional.

## 2018-12-13 NOTE — Progress Notes (Signed)
Advanced care plan.  Purpose of the Encounter: CODE STATUS  Parties in Attendance: Patient's wife  Patient's Decision Capacity: Limited  Subjective/Patient's story: 85 y.o.malewith a past medical history of bilateral PE, hyperlipidemia, hypothyroidism, diabetes mellitus, hypertension, syncopal episodes, and anemia presenting to the ED with altered mental status.  Patient noticed to have pneumonia.    Objective/Medical story Patient is 85 year old admitted with shortness of breath noted to have pneumonia also now has CHF.  I discussed with the wife regarding patient not improving.   Goals of care determination:  Plan is for patient to continue diuresis today and tomorrow if no significant improvement he will go home with hospice   CODE STATUS: Full code   Time spent discussing advanced care planning: 16 minutes

## 2018-12-14 LAB — GLUCOSE, CAPILLARY
Glucose-Capillary: 218 mg/dL — ABNORMAL HIGH (ref 70–99)
Glucose-Capillary: 135 mg/dL — ABNORMAL HIGH (ref 70–99)
Glucose-Capillary: 184 mg/dL — ABNORMAL HIGH (ref 70–99)
Glucose-Capillary: 185 mg/dL — ABNORMAL HIGH (ref 70–99)
Glucose-Capillary: 214 mg/dL — ABNORMAL HIGH (ref 70–99)

## 2018-12-14 LAB — BASIC METABOLIC PANEL
Anion gap: 9 (ref 5–15)
BUN: 28 mg/dL — ABNORMAL HIGH (ref 8–23)
CO2: 35 mmol/L — ABNORMAL HIGH (ref 22–32)
Calcium: 8.9 mg/dL (ref 8.9–10.3)
Chloride: 98 mmol/L (ref 98–111)
Creatinine, Ser: 1.44 mg/dL — ABNORMAL HIGH (ref 0.61–1.24)
GFR calc Af Amer: 46 mL/min — ABNORMAL LOW (ref >60)
GFR calc non Af Amer: 40 mL/min — ABNORMAL LOW (ref >60)
Glucose, Bld: 202 mg/dL — ABNORMAL HIGH (ref 70–99)
Potassium: 3.6 mmol/L (ref 3.5–5.1)
Sodium: 142 mmol/L (ref 135–145)

## 2018-12-14 LAB — CULTURE, BLOOD (ROUTINE X 2)
Culture: NO GROWTH
Culture: NO GROWTH
Special Requests: ADEQUATE

## 2018-12-14 MED ORDER — METOLAZONE 5 MG PO TABS
10.0000 mg | ORAL_TABLET | Freq: Once | ORAL | Status: AC
  Administered 2018-12-14: 09:00:00 10 mg via ORAL
  Filled 2018-12-14: qty 2

## 2018-12-14 NOTE — Plan of Care (Signed)
  Problem: Clinical Measurements: Goal: Ability to maintain clinical measurements within normal limits will improve Outcome: Progressing Goal: Will remain free from infection Outcome: Progressing Goal: Diagnostic test results will improve Outcome: Progressing Goal: Cardiovascular complication will be avoided Outcome: Progressing   Problem: Elimination: Goal: Will not experience complications related to urinary retention Outcome: Progressing   Problem: Skin Integrity: Goal: Risk for impaired skin integrity will decrease Outcome: Progressing

## 2018-12-14 NOTE — Progress Notes (Signed)
[employer] [facility] [place] at [facility]   PATIENT NAME: Ruben Ruiz    MR#:  [id number]  DATE OF BIRTH:  Oct 23, 1917  SUBJECTIVE:  CHIEF COMPLAINT:  No chief complaint on file.  Awake and pleasant.  Confused.  On oxygen.  REVIEW OF SYSTEMS:   Dementia  DRUG ALLERGIES:  No Known Allergies VITALS:  Blood pressure 122/77, pulse 82, temperature 98.2 F (36.8 C), temperature source Oral, resp. rate 19, height 6' (1.829 m), weight 81.4 kg, SpO2 99 %. PHYSICAL EXAMINATION:  Physical Exam  GENERAL:  85 y.o.-year-old patient lying in the bed with no acute distress.  EYES: Pupils equal, round, reactive to light and accommodation. No scleral icterus. Extraocular muscles intact.  HEENT: Head atraumatic, normocephalic. Oropharynx and nasopharynx clear.  NECK:  Supple, no jugular venous distention. No thyroid enlargement, no tenderness.  LUNGS: Decreasedl breath sounds bilaterally,bilateral wheezing, rales and rhonchi. No crepitation. No use of accessory muscles of respiration.  CARDIOVASCULAR: S1, S2 normal. No murmurs, rubs, or gallops.  ABDOMEN: Soft, nontender, nondistended. Bowel sounds present. No organomegaly or mass.  EXTREMITIES:  2+ bilateral pedal edema, NO cyanosis, or clubbing. No rash or lesions. + pedal pulses MUSCULOSKELETAL: Normal bulk, and power was 5+ grip and elbow, knee, and ankle flexion and extension bilaterally.  NEUROLOGIC:  Patient is awake able to follow commands pSYCHIATRIC: The patient is alert able to answer questions SKIN: No obvious rash, lesion, or ulcer. LABORATORY PANEL:  Male CBC Recent Labs  Lab 12/13/18 0724  WBC 7.0  HGB 12.1*  HCT 40.3  PLT 149*   ------------------------------------------------------------------------------------------------------------------ Chemistries  Recent Labs  Lab 12/09/18 1040  12/11/18 0628  12/14/18 0945  NA 142   < > 146*   < > 142  K 4.7   < > 3.7   < > 3.6  CL 102   < > 107   < > 98   CO2 27   < > 30   < > 35*  GLUCOSE 440*   < > 117*   < > 202*  BUN 34*   < > 31*   < > 28*  CREATININE 1.85*   < > 1.49*   < > 1.44*  CALCIUM 9.3   < > 8.9   < > 8.9  MG  --    < > 2.5*  --   --   AST 35  --   --   --   --   ALT 30  --   --   --   --   ALKPHOS 140*  --   --   --   --   BILITOT 1.9*  --   --   --   --    < > = values in this interval not displayed.   RADIOLOGY:  No results found. ASSESSMENT AND PLAN:   85 y.o. male with a past medical history of bilateral PE, hyperlipidemia, hypothyroidism, diabetes mellitus, hypertension, syncopal episodes, and anemia presenting to the ED with altered mental status.  *Acute metabolic encephalopathy over dementia Due to acute illness Improving   * Acute respiratory failure secondary to pneumonia -Supplemental oxygen -DuoNebs every 6 for wheezing Slowly improving On antibiotics-Ceftin  *Bilateral lower extremity edema.  On IV Lasix.  Diuresing slowly.  * AKI  repeat BMP in the morning  * History of bilateral PE -Continue Eliquis  * HTN  -Continue amlodipine -Hold lisinopril  * Diabetes Mellitus Type 2 with complications - Hold metformin +  glipizide - CBG monitoring - SSI - DM education and close PCP follow up  * Hypothyroidism -continue Synthroid  DVT proph: Patient on Eliquis  All the records are reviewed and case discussed with Care Management/Social Worker. Management plans discussed with the patient, family and they are in agreement.  CODE STATUS: DNR  TOTAL TIME TAKING CARE OF THIS PATIENT: 35 minutes.   POSSIBLE D/C IN 1-2 DAYS, DEPENDING ON CLINICAL CONDITION.  Leia Alf Mikie Misner M.D on 12/14/2018 at 3:02 PM  Between 7am to 6pm - Pager - [phone]  After 6pm go to [email or web address] - Proofreader  [facility] [place] [facility]  Office  [phone]  CC: Primary care physician; Denton Lank, MD  Note: This dictation was prepared with Dragon dictation along with smaller  phrase technology. Any transcriptional errors that result from this process are unintentional.

## 2018-12-15 LAB — BASIC METABOLIC PANEL
Anion gap: 9 (ref 5–15)
BUN: 27 mg/dL — ABNORMAL HIGH (ref 8–23)
CO2: 38 mmol/L — ABNORMAL HIGH (ref 22–32)
Calcium: 9 mg/dL (ref 8.9–10.3)
Chloride: 93 mmol/L — ABNORMAL LOW (ref 98–111)
Creatinine, Ser: 1.24 mg/dL (ref 0.61–1.24)
GFR calc Af Amer: 55 mL/min — ABNORMAL LOW (ref >60)
GFR calc non Af Amer: 48 mL/min — ABNORMAL LOW (ref >60)
Glucose, Bld: 107 mg/dL — ABNORMAL HIGH (ref 70–99)
Potassium: 3.2 mmol/L — ABNORMAL LOW (ref 3.5–5.1)
Sodium: 140 mmol/L (ref 135–145)

## 2018-12-15 LAB — GLUCOSE, CAPILLARY
Glucose-Capillary: 99 mg/dL (ref 70–99)
Glucose-Capillary: 110 mg/dL — ABNORMAL HIGH (ref 70–99)
Glucose-Capillary: 162 mg/dL — ABNORMAL HIGH (ref 70–99)
Glucose-Capillary: 179 mg/dL — ABNORMAL HIGH (ref 70–99)

## 2018-12-15 MED ORDER — POTASSIUM CHLORIDE CRYS ER 20 MEQ PO TBCR
40.0000 meq | EXTENDED_RELEASE_TABLET | Freq: Once | ORAL | Status: AC
  Administered 2018-12-15: 40 meq via ORAL
  Filled 2018-12-15: qty 2

## 2018-12-15 MED ORDER — METOLAZONE 5 MG PO TABS
5.0000 mg | ORAL_TABLET | Freq: Once | ORAL | Status: AC
  Administered 2018-12-15: 5 mg via ORAL
  Filled 2018-12-15: qty 1

## 2018-12-15 NOTE — Progress Notes (Signed)
Pt on the floor at 0153am with bed alarm sounding. Floor mats were in place. No s/s of injury. Pt denies pain. Neuro check WDL. MD notified. Will continue to monitor and assess per protocol.

## 2018-12-15 NOTE — Progress Notes (Signed)
Ruben Ruiz at [facility] NAME: Ruben Ruiz    MR#:  [id number]  DATE OF BIRTH:  05-23-1917  SUBJECTIVE:  CHIEF COMPLAINT:  No chief complaint on file.  Awake and pleasant.   Wife at bedside Poor historian  REVIEW OF SYSTEMS:   Dementia  DRUG ALLERGIES:  No Known Allergies VITALS:  Blood pressure (!) 138/99, pulse 84, temperature 97.7 F (36.5 C), temperature source Oral, resp. rate 19, height 6' (1.829 m), weight 76.5 kg, SpO2 97 %. PHYSICAL EXAMINATION:  Physical Exam  GENERAL:  85 y.o.-year-old patient lying in the bed with no acute distress.  EYES: Pupils equal, round, reactive to light and accommodation. No scleral icterus. Extraocular muscles intact.  HEENT: Head atraumatic, normocephalic. Oropharynx and nasopharynx clear.  NECK:  Supple, no jugular venous distention. No thyroid enlargement, no tenderness.  LUNGS: Decreasedl breath sounds bilaterally,bilateral wheezing, rales and rhonchi. No crepitation. No use of accessory muscles of respiration.  CARDIOVASCULAR: S1, S2 normal. No murmurs, rubs, or gallops.  ABDOMEN: Soft, nontender, nondistended. Bowel sounds present. No organomegaly or mass.  EXTREMITIES:  2+ bilateral pedal edema, No cyanosis, or clubbing. No rash or lesions. + pedal pulses. MUSCULOSKELETAL: Normal bulk, and power was 5+ grip and elbow, knee, and ankle flexion and extension bilaterally.  NEUROLOGIC:  Patient is awake able to follow commands pSYCHIATRIC: The patient is alert able to answer questions SKIN: No obvious rash, lesion, or ulcer. LABORATORY PANEL:  Male CBC Recent Labs  Lab 12/13/18 0724  WBC 7.0  HGB 12.1*  HCT 40.3  PLT 149*   ------------------------------------------------------------------------------------------------------------------ Chemistries  Recent Labs  Lab 12/09/18 1040  12/11/18 0628  12/15/18 0856  NA 142   < > 146*   < > 140  K 4.7   < > 3.7   < > 3.2*  CL 102   < >  107   < > 93*  CO2 27   < > 30   < > 38*  GLUCOSE 440*   < > 117*   < > 107*  BUN 34*   < > 31*   < > 27*  CREATININE 1.85*   < > 1.49*   < > 1.24  CALCIUM 9.3   < > 8.9   < > 9.0  MG  --    < > 2.5*  --   --   AST 35  --   --   --   --   ALT 30  --   --   --   --   ALKPHOS 140*  --   --   --   --   BILITOT 1.9*  --   --   --   --    < > = values in this interval not displayed.   RADIOLOGY:  No results found. ASSESSMENT AND PLAN:   85 y.o. male with a past medical history of bilateral PE, hyperlipidemia, hypothyroidism, diabetes mellitus, hypertension, syncopal episodes, and anemia presenting to the ED with altered mental status.   * Bilateral lower extremity edema.  On IV Lasix.  Diuresing slowly. Will need 1 more day of diuresis.  Will give 1 dose of metolazone along with IV Lasix.  * Acute metabolic encephalopathy over dementia Due to acute illness Improving  * Acute respiratory failure secondary to pneumonia -Supplemental oxygen -DuoNebs every 6 for wheezing Slowly improving On antibiotics-Ceftin Will likely need oxygen at discharge  * AKI  repeat BMP in the  morning  * History of bilateral PE -Continue Ruben Ruiz  * HTN  -Continue amlodipine -Hold lisinopril  * Diabetes Mellitus Type 2 with complications - Hold metformin + glipizide - CBG monitoring - SSI - DM education and close PCP follow up  * Hypothyroidism -continue Synthroid  DVT prophylaxis: Patient on Ruben Ruiz.  All the records are reviewed and case discussed with Care Management/Social Worker. Management plans discussed with the patient, family and they are in agreement.  CODE STATUS: Ruben Ruiz  TOTAL TIME TAKING CARE OF THIS PATIENT: 35 minutes.   POSSIBLE D/C IN 1-2 DAYS, DEPENDING ON CLINICAL CONDITION.  Ruben Ruiz Ruben Ruiz on 12/15/2018 at 11:40 AM  Between 7am to 6pm - Pager - [phone]  After 6pm go to [email or web address] - Social research officer, [email or web address] [email or web address]  [facility] [place] [facility]   Office  [phone]  CC: Primary care physician; Ruben Ruiz, [name], MD  Note: This dictation was prepared with Dragon dictation along with smaller phrase technology. Any transcriptional errors that result from this process are unintentional.

## 2018-12-15 NOTE — TOC Progression Note (Signed)
Transition of Care [facility]) - Progression Note    Patient Details  Name: Ruben Ruiz MRN: [id number] Date of Birth: [birth year]  Transition of Care [facility]) CM/SW Contact  Latanya Maudlin, RN Phone Number: 12/15/2018, 2:20 PM  Clinical Narrative:   Patient was originally worked up for SNF. When [facility] team met with spouse to present bed offers she informed this writer she was more interested in home with hospice level care at this time. She wished to discuss further with family/medical team. She informed me she would use [facility] and would likely need extensive DME as there is none currently in the home. Patient would like need hospital bed, oxygen, wheelchair and bedside commode. I have notified April from hospice of possible referral on Monday.      Expected Discharge Plan: Home w Hospice Care Barriers to Discharge: Continued Medical Work up  Expected Discharge Plan and Services Expected Discharge Plan: [place] In-house Referral: Clinical Social Work Discharge Planning Services: CM Consult Post Acute Care Choice: Hospice Living arrangements for the past 2 months: Single Family Home                 DME Arranged: N/A DME Agency: NA                   Social Determinants of Health (SDOH) Interventions    Readmission Risk Interventions Readmission Risk Prevention Plan 12/15/2018  Transportation Screening Complete  PCP or Specialist Appt within 5-7 Days Complete  Home Care Screening Complete  Medication Review (RN CM) Complete  Some recent data might be hidden

## 2018-12-15 NOTE — Plan of Care (Signed)
  Problem: Education: Goal: Knowledge of General Education information will improve Description: Including pain rating scale, medication(s)/side effects and non-pharmacologic comfort measures Outcome: Not Progressing Note: Patient is 85 years old w/ dementia. Per notes, most likely will be going home w/ hospice services upon discharge, possibly as soon as tomorrow. Will continue to monitor overall discharge progression. Wenda Low [facility]

## 2018-12-16 LAB — GLUCOSE, CAPILLARY
Glucose-Capillary: 157 mg/dL — ABNORMAL HIGH (ref 70–99)
Glucose-Capillary: 176 mg/dL — ABNORMAL HIGH (ref 70–99)
Glucose-Capillary: 185 mg/dL — ABNORMAL HIGH (ref 70–99)

## 2018-12-16 MED ORDER — IPRATROPIUM-ALBUTEROL 0.5-2.5 (3) MG/3ML IN SOLN
3.0000 mL | Freq: Three times a day (TID) | RESPIRATORY_TRACT | Status: DC
  Administered 2018-12-16 (×2): 3 mL via RESPIRATORY_TRACT
  Filled 2018-12-16 (×2): qty 3

## 2018-12-16 MED ORDER — POTASSIUM CHLORIDE ER 20 MEQ PO TBCR: 20.0000 meq | EXTENDED_RELEASE_TABLET | Freq: Every day | ORAL | 0 refills | Status: AC

## 2018-12-16 MED ORDER — FUROSEMIDE 40 MG PO TABS: 20.0000 mg | ORAL_TABLET | Freq: Every day | ORAL | 0 refills | Status: AC

## 2018-12-16 MED ORDER — IPRATROPIUM-ALBUTEROL 0.5-2.5 (3) MG/3ML IN SOLN: 3.0000 mL | Freq: Four times a day (QID) | RESPIRATORY_TRACT | Status: DC | PRN

## 2018-12-16 NOTE — Care Management Important Message (Signed)
Important Message  Patient Details  Name: Ruben Ruiz MRN: [id number] Date of Birth: November 06, 1917   Medicare Important Message Given:  Yes     [name] 12/16/2018, 12:19 PM

## 2018-12-16 NOTE — Progress Notes (Signed)
Pt discharged home via EMS at 1737. Pt was A&Ox3. VSS. Pt left with shorts and home medications. This RN called Mrs. Delap to let her know the pt was on the way home. AVS was reviewed with pts wife over the phone and all questions were answered. A copy of the AVS was given to transport.

## 2018-12-16 NOTE — Progress Notes (Signed)
New referral for [place] hospice services at home received over the weekend from [facility]. Patient is a 85 year old man admitted to [facility] from home on 8/10 for treatment of altered mental status. PMH includes dementia, DM II GERD, HOH and HTN. IN the ED he was found to have pneumonia and has received treatment. He has also required IV lasix for diuresis related to new onset CHF. Family has talked with attending physician and have chosen to have patient to discharge home with the support of hospice services. Writer met in the room with patient and his wife Katharine Look to initiate education regarding hospice services, philosophy and team approach to care. Writer also notified patient/family and referral source in person of inability to provide volunteer services and ability to provide virtual visits upon request due to [place] pandemic.  DME needs discussed, patient will need a hospital bed and oxygen in the home prior to discharge. Plan s for delivery as soon as possible. Patient will discharge via EMS with signed DNR in place. Patient information faxed to referral. Contact number given to Mrs. Mixon. Hospital care team updated. Flo Shanks BSN, RN, [facility] [phone]

## 2018-12-23 NOTE — Discharge Summary (Signed)
[place] at [place] NAME: Ruben Ruiz    MR#:  [id number]  DATE OF BIRTH:  07-20-1917  DATE OF ADMISSION:  12/09/2018 ADMITTING PHYSICIAN: Lang Snow, NP  DATE OF DISCHARGE: 12/16/2018  5:39 PM  PRIMARY CARE PHYSICIAN: Denton Lank, MD   ADMISSION DIAGNOSIS:  Community acquired pneumonia of right lower lobe of lung ([place]) [J18.1]  DISCHARGE DIAGNOSIS:  Active Problems:   Pneumonia   SECONDARY DIAGNOSIS:   Past Medical History:  Diagnosis Date  . Dementia ([place])   . Diabetes mellitus without complication ([place])   . GERD (gastroesophageal reflux disease)   . HOH (hard of hearing)    AID  . Hypertension      ADMITTING HISTORY  HISTORY OF PRESENT ILLNESS:   85 year old male with a past medical history of bilateral PE, hyperlipidemia, hypothyroidism, diabetes mellitus, hypertension, syncopal episodes, and anemia presenting to the ED with altered mental status.  Patient unable to provide history so history mostly obtained from patient's chart.  Per ED reports patient was found unresponsive on the floor per wife.  No reports of associated symptoms prior to or following the fall.  He has history of syncope however this was not witnessed per wife.  On arrival to the ED, he was afebrile with blood pressure 128/95 mm Hg and pulse rate 103 beats/min. There were no focal neurological deficits; he was alert and oriented x 2.  Initial labs revealed PCO2 65, bicarb 37.6, PO2 64, blood glucose 440, BUN 34, creatinine 1.85, alk phos 140, total bilirubin 1.9, WBC 7.3, platelets 118.  UA negative for UTI.  Chest x-ray showed right lower lobe airspace opacity suspicious for pneumonia and small right pleural effusion.  HOSPITAL COURSE:   85 y.o.malewith a past medical history of bilateral PE, hyperlipidemia, hypothyroidism, diabetes mellitus, hypertension, syncopal episodes, and anemia presenting to the ED with altered mental  status.   * Bilateral lower extremity edema.  On IV Lasix.  Diuresing slowly.    IV Lasix switched to oral.  * Acute metabolic encephalopathy over dementia Due to acute illness   Close to baseline at discharge  *Acute respiratory failure secondary to pneumonia -Supplemental oxygen -DuoNebs every 6 for wheezing Slowly improving  patient was treated with IV antibiotics and change to oral Ceftin.  *AKI repeat BMP in the morning  *History of bilateral PE -Continue Eliquis  *HTN  -Continue amlodipine   Lisinopril held  *Diabetes Mellitus Type 2 with complications  oral hypoglycemics held on admission.  Sliding scale insulin used in the hospital.  *Hypothyroidism-continue Synthroid  DVT prophylaxis: Patient on Eliquis.   with patient's advanced age, dementia and multiple medical problems patient is being discharged home with hospice.  Transition to comfort care if any worsening.  Discussed with wife in detail on day of discharge.  CONSULTS OBTAINED:    DRUG ALLERGIES:  No Known Allergies  DISCHARGE MEDICATIONS:   Allergies as of 12/16/2018   No Known Allergies     Medication List    STOP taking these medications   amLODipine 5 MG tablet Commonly known as: NORVASC   losartan 100 MG tablet Commonly known as: COZAAR     TAKE these medications   aspirin EC 81 MG tablet Take 1 tablet (81 mg total) by mouth daily.   Eliquis 5 MG Tabs tablet Generic drug: apixaban Take 5 mg by mouth 2 (two) times daily.   furosemide 40 MG tablet Commonly known as: Lasix Take 0.5 tablets (  20 mg total) by mouth daily.   glipiZIDE 5 MG tablet Commonly known as: Glucotrol Take 1 tablet (5 mg total) by mouth daily.   levothyroxine 50 MCG tablet Commonly known as: SYNTHROID Take 1 tablet (50 mcg total) by mouth daily before breakfast.   metFORMIN 1000 MG tablet Commonly known as: GLUCOPHAGE Take 1,000 mg by mouth daily.   omeprazole 20 MG capsule Commonly  known as: PRILOSEC Take 2 capsules (40 mg total) by mouth at bedtime.   Potassium Chloride ER 20 MEQ Tbcr Take 20 mEq by mouth daily.   pravastatin 20 MG tablet Commonly known as: PRAVACHOL Take 1 tablet (20 mg total) by mouth at bedtime.   triamcinolone cream 0.1 % Commonly known as: KENALOG Apply 1 application topically 2 (two) times daily.       Today   VITAL SIGNS:  Blood pressure 109/78, pulse 89, temperature 97.9 F (36.6 C), temperature source Oral, resp. rate 19, height 6' (1.829 m), weight 73.4 kg, SpO2 94 %.  I/O:  No intake or output data in the 24 hours ending 12/23/18 1753  PHYSICAL EXAMINATION:  Physical Exam  GENERAL:  85 y.o.-year-old patient lying in the bed with no acute distress.  LUNGS: Normal breath sounds bilaterally, no wheezing, rales,rhonchi or crepitation. No use of accessory muscles of respiration.  CARDIOVASCULAR: S1, S2 normal. No murmurs, rubs, or gallops.  ABDOMEN: Soft, non-tender, non-distended. Bowel sounds present. No organomegaly or mass.  NEUROLOGIC: Moves all 4 extremities. PSYCHIATRIC: The patient is alert and   Awake.  Pleasantly confused SKIN: No obvious rash, lesion, or ulcer.   DATA REVIEW:   CBC No results for input(s): WBC, HGB, HCT, PLT in the last 168 hours.  Chemistries  No results for input(s): NA, K, CL, CO2, GLUCOSE, BUN, CREATININE, CALCIUM, MG, AST, ALT, ALKPHOS, BILITOT in the last 168 hours.  Invalid input(s): GFRCGP  Cardiac Enzymes No results for input(s): TROPONINI in the last 168 hours.  Microbiology Results  Results for orders placed or performed during the hospital encounter of 12/09/18  SARS Coronavirus 2 [facility] order, Performed in [facility] hospital lab) Nasopharyngeal Nasopharyngeal Swab     Status: None   Collection Time: 12/09/18 10:35 AM   Specimen: Nasopharyngeal Swab  Result Value Ref Range Status   SARS Coronavirus 2 NEGATIVE NEGATIVE Final    Comment: (NOTE) If result is  NEGATIVE SARS-CoV-2 target nucleic acids are NOT DETECTED. The SARS-CoV-2 RNA is generally detectable in upper and lower  respiratory specimens during the acute phase of infection. The lowest  concentration of SARS-CoV-2 viral copies this assay can detect is 250  copies / mL. A negative result does not preclude SARS-CoV-2 infection  and should not be used as the sole basis for treatment or other  patient management decisions.  A negative result may occur with  improper specimen collection / handling, submission of specimen other  than nasopharyngeal swab, presence of viral mutation(s) within the  areas targeted by this assay, and inadequate number of viral copies  (<250 copies / mL). A negative result must be combined with clinical  observations, patient history, and epidemiological information. If result is POSITIVE SARS-CoV-2 target nucleic acids are DETECTED. The SARS-CoV-2 RNA is generally detectable in upper and lower  respiratory specimens dur ing the acute phase of infection.  Positive  results are indicative of active infection with SARS-CoV-2.  Clinical  correlation with patient history and other diagnostic information is  necessary to determine patient infection status.  Positive results  do  not rule out bacterial infection or co-infection with other viruses. If result is PRESUMPTIVE POSTIVE SARS-CoV-2 nucleic acids MAY BE PRESENT.   A presumptive positive result was obtained on the submitted specimen  and confirmed on repeat testing.  While 2019 novel coronavirus  (SARS-CoV-2) nucleic acids may be present in the submitted sample  additional confirmatory testing may be necessary for epidemiological  and / or clinical management purposes  to differentiate between  SARS-CoV-2 and other Sarbecovirus currently known to infect humans.  If clinically indicated additional testing with an alternate test  methodology [phone]) is advised. The SARS-CoV-2 RNA is generally  detectable  in upper and lower respiratory sp ecimens during the acute  phase of infection. The expected result is Negative. Fact Sheet for Patients:  StrictlyIdeas.no Fact Sheet for Healthcare Providers: BankingDealers.co.za This test is not yet approved or cleared by the [place] FDA and has been authorized for detection and/or diagnosis of SARS-CoV-2 by FDA under an Emergency Use Authorization (EUA).  This EUA will remain in effect (meaning this test can be used) for the duration of the COVID-19 declaration under Section 564(b)(1) of the Act, 21 U.S.C. section 360bbb-3(b)(1), unless the authorization is terminated or revoked sooner. Performed at [facility], [address] [place], [place] [address]   Blood Culture (routine x 2)     Status: None   Collection Time: 12/09/18 10:46 AM   Specimen: BLOOD  Result Value Ref Range Status   Specimen Description BLOOD EJ  Final   Special Requests   Final    BOTTLES DRAWN AEROBIC AND ANAEROBIC Blood Culture adequate volume   Culture   Final    NO GROWTH 5 DAYS Performed at [facility], [address] [place], [place] [address]    Report Status 12/14/2018 FINAL  Final  Blood Culture (routine x 2)     Status: None   Collection Time: 12/09/18 10:46 AM   Specimen: BLOOD  Result Value Ref Range Status   Specimen Description BLOOD RIGHT WRIST  Final   Special Requests   Final    BOTTLES DRAWN AEROBIC AND ANAEROBIC Blood Culture results may not be optimal due to an inadequate volume of blood received in culture bottles   Culture   Final    NO GROWTH 5 DAYS Performed at [facility], [address] [place], [place] [address]    Report Status 12/14/2018 FINAL  Final  Urine culture     Status: None   Collection Time: 12/09/18 10:46 AM   Specimen: In/Out Cath Urine  Result Value Ref Range Status   Specimen Description   Final    IN/OUT CATH URINE Performed at  [facility], [address] [place], [place] [address]    Special Requests   Final    NONE Performed at [facility], [address] [place], [place] [address]    Culture   Final    NO GROWTH Performed at [facility], [place] [address] [place], [place] [address]    Report Status 12/10/2018 FINAL  Final    RADIOLOGY:  No results found.  Follow up with PCP in 1 week.  Management plans discussed with the patient, family and they are in agreement.  CODE STATUS:  Code Status History    Date Active Date Inactive Code Status Order ID Comments User Context   12/09/2018 1328 12/16/2018 2049 DNR [id number]  Lang Snow, NP ED   07/02/2018 1202 07/03/2018 2023  DNR [id number]  Vaughan Basta, MD ED   04/15/2018 0425 04/16/2018 2039 Full Code [id number]  Harrie Foreman, MD Inpatient   06/18/2015 1736 06/20/2015 1716 Full Code [id number]  Gladstone Lighter, MD Inpatient   Advance Care Planning Activity    Questions for Most Recent Historical Code Status (Order [id number])    Question Answer Comment   In the event of cardiac or respiratory ARREST Do not call a "code blue"    In the event of cardiac or respiratory ARREST Do not perform Intubation, CPR, defibrillation or ACLS    In the event of cardiac or respiratory ARREST Use medication by any route, position, wound care, and other measures to relive pain and suffering. May use oxygen, suction and manual treatment of airway obstruction as needed for comfort.       TOTAL TIME TAKING CARE OF THIS PATIENT ON DAY OF DISCHARGE: more than 30 minutes.   Leia Alf Jakoby Melendrez M.D on 12/23/2018 at 5:53 PM  Between 7am to 6pm - Pager - [phone]  After 6pm go to [email or web address] - password [email or web address] [place] [facility]  Office  [phone]  CC: Primary care physician; Denton Lank, MD  Note: This dictation was prepared with Dragon dictation along with smaller phrase technology. Any transcriptional  errors that result from this process are unintentional.

## 2019-01-30 DEATH — deceased
